# Patient Record
Sex: Female | Born: 1940
Health system: Southern US, Community
[De-identification: ages and names within clinical notes are randomized; demographics above are authoritative.]

## PROBLEM LIST (undated history)

## (undated) DIAGNOSIS — E079 Disorder of thyroid, unspecified: Secondary | ICD-10-CM

## (undated) DIAGNOSIS — M8580 Other specified disorders of bone density and structure, unspecified site: Secondary | ICD-10-CM

## (undated) DIAGNOSIS — F411 Generalized anxiety disorder: Secondary | ICD-10-CM

## (undated) HISTORY — DX: Other specified disorders of bone density and structure, unspecified site: M85.80

## (undated) HISTORY — DX: Generalized anxiety disorder: F41.1

---

## 2007-05-26 HISTORY — PX: CHOLECYSTECTOMY: SHX55

## 2007-05-26 HISTORY — PX: ABDOMINAL HYSTERECTOMY: SHX81

## 2015-12-31 ENCOUNTER — Encounter (HOSPITAL_COMMUNITY): Payer: Self-pay

## 2015-12-31 DIAGNOSIS — S0101XA Laceration without foreign body of scalp, initial encounter: Secondary | ICD-10-CM | POA: Diagnosis not present

## 2015-12-31 DIAGNOSIS — S51812A Laceration without foreign body of left forearm, initial encounter: Secondary | ICD-10-CM | POA: Insufficient documentation

## 2015-12-31 DIAGNOSIS — Y9289 Other specified places as the place of occurrence of the external cause: Secondary | ICD-10-CM | POA: Diagnosis not present

## 2015-12-31 DIAGNOSIS — W228XXA Striking against or struck by other objects, initial encounter: Secondary | ICD-10-CM | POA: Insufficient documentation

## 2015-12-31 DIAGNOSIS — Y9353 Activity, golf: Secondary | ICD-10-CM | POA: Diagnosis not present

## 2015-12-31 DIAGNOSIS — Y999 Unspecified external cause status: Secondary | ICD-10-CM | POA: Insufficient documentation

## 2015-12-31 DIAGNOSIS — S0990XA Unspecified injury of head, initial encounter: Secondary | ICD-10-CM | POA: Diagnosis present

## 2015-12-31 NOTE — ED Triage Notes (Signed)
Pt was holding onto stick leaning into creek retrieving golf balls, when she went to get up stick broke, pt fell into creek hitting left side of head, bleeding controlled and laceration to left posterior forearm.

## 2016-01-01 ENCOUNTER — Emergency Department (HOSPITAL_COMMUNITY): Payer: Medicare Other

## 2016-01-01 ENCOUNTER — Emergency Department (HOSPITAL_COMMUNITY)
Admission: EM | Admit: 2016-01-01 | Discharge: 2016-01-01 | Disposition: A | Discharge status: Home / Self Care | Payer: Medicare Other | Attending: Emergency Medicine | Admitting: Emergency Medicine

## 2016-01-01 DIAGNOSIS — S0101XA Laceration without foreign body of scalp, initial encounter: Secondary | ICD-10-CM

## 2016-01-01 HISTORY — DX: Disorder of thyroid, unspecified: E07.9

## 2016-01-01 MED ORDER — LIDOCAINE-EPINEPHRINE (PF) 2 %-1:200000 IJ SOLN
20.0000 mL | Freq: Once | INTRAMUSCULAR | Status: AC
  Administered 2016-01-01: 20 mL

## 2016-01-01 MED ORDER — ACETAMINOPHEN 500 MG PO TABS
1000.0000 mg | ORAL_TABLET | Freq: Once | ORAL | Status: AC
  Administered 2016-01-01: 1000 mg via ORAL

## 2016-01-01 MED ORDER — LIDOCAINE HCL (PF) 2 % IJ SOLN
INTRAMUSCULAR | Status: AC
  Filled 2016-01-01: qty 10

## 2016-01-01 MED ORDER — LIDOCAINE-EPINEPHRINE (PF) 2 %-1:200000 IJ SOLN
INTRAMUSCULAR | Status: AC
  Administered 2016-01-01: 20 mL
  Filled 2016-01-01: qty 20

## 2016-01-01 MED ORDER — ACETAMINOPHEN 500 MG PO TABS
ORAL_TABLET | ORAL | Status: AC
  Filled 2016-01-01: qty 2

## 2016-01-01 NOTE — ED Notes (Signed)
Pt back from XR 

## 2016-01-01 NOTE — ED Notes (Signed)
Patient transported to CT 

## 2016-01-01 NOTE — ED Notes (Signed)
Pt and family understood dc material. NAD noted 

## 2016-01-01 NOTE — ED Notes (Signed)
Pt is frustrated at wait times. Spoke with MD and she state she would be in there soon. Relayed message to pt and family member.

## 2016-01-01 NOTE — Discharge Instructions (Signed)
Return for worsening symptoms, including increased redness, swelling, pus drainage, fever, escalating pain or any other symptoms concerning to you.  Your scalp staple will need to be take out in 7 days. Your arm laceration sutures will need to be removed in 10 days.

## 2016-01-01 NOTE — ED Provider Notes (Signed)
[facility] DEPT Provider Note   CSN: [id number] Arrival date & time: 12/31/15  2054  First Provider Contact:   First MD Initiated Contact with Patient 01/01/16 0226     By signing my name below, I, Arianna Nassar and Dolores Hoose attest that this documentation has been prepared under the direction and in the presence of Forde Dandy, MD.  Electronically Signed: Julien Nordmann, ED Scribe. 01/01/16. 2:21 AM.   History   Chief Complaint Chief Complaint  Patient presents with  . Laceration  . Head Injury    The history is provided by the patient. No language interpreter was used.     HPI Comments: Ivana Yusupov is a 75 y.o. female with PMHx of thyroid disease who presents to the Emergency Department presenting after a head injury that occurred this afternoon. She [place] to retrieve golf balls when she fell onto some rocks. Pt hit the left side of her head and her left forearm causing a laceration to both. She did not lose consciousness. She was ambulatory after the fall with no difficulty. Bleeding is currently controlled on her head and left forearm lacerations. Pt states she is not on any blood thinners. She denies neck pain, back pain, nausea, vomiting, headache, vision or speech changes, confusion, trouble walking, stomach pains, leg pain and balance problems. Her last tetanus shot was ~ 3 months ago.    Past Medical History:  Diagnosis Date  . Thyroid disease     There are no active problems to display for this patient.   Past Surgical History:  Procedure Laterality Date  . ABDOMINAL HYSTERECTOMY    . CHOLECYSTECTOMY      OB History    No data available       Home Medications    Prior to Admission medications   Not on File    Family History History reviewed. No pertinent family history.  Social History Social History  Substance Use Topics  . Smoking status: Never Smoker  . Smokeless tobacco: Never Used    . Alcohol use No     Allergies   Review of patient's allergies indicates no known allergies.   Review of Systems Review of Systems   10/14 Systems reviewed and are negative for acute change except as noted in the HPI.   Physical Exam Updated Vital Signs BP 160/69   Pulse (!) 53   Temp 98.2 F (36.8 C) (Oral)   Resp 20   Ht 5\' 7"  (1.702 m)   Wt 146 lb (66.2 kg)   SpO2 99%   BMI 22.87 kg/m   Physical Exam Physical Exam  Nursing note and vitals reviewed. Constitutional: Well developed, well nourished, non-toxic, and in no acute distress Head: Normocephalic. 1 cm laceration to left temporal scalp.  Mouth/Throat: Oropharynx is clear and moist.  Neck: Normal range of motion. Neck supple.  Cardiovascular: Normal rate and regular rhythm.   Pulmonary/Chest: Effort normal and breath sounds normal.  Abdominal: Soft. There is no tenderness. There is no rebound and no guarding.  Musculoskeletal: No CTLS spine tenderness, Step offs or deformities. 3 cm irregular gaping laceration to proximal left forearm.  Neurological: Alert, no facial droop, fluent speech, moves all extremities symmetrically Skin: Skin is warm and dry.  Psychiatric: Cooperative   ED Treatments / Results  DIAGNOSTIC STUDIES: Oxygen Saturation is 95% on RA, adequate by my interpretation.  COORDINATION OF CARE: 2:26 AM Discussed treatment plan which includes CT head,  forearm X-ray, sutures to left forearm, stable to head and Tylenol with pt at bedside and pt agreed to plan.  Labs (all labs ordered are listed, but only abnormal results are displayed) Labs Reviewed - No data to display  EKG  EKG Interpretation None       Radiology Dg Forearm Left  Result Date: 01/01/2016 CLINICAL DATA:  Laceration after fall in creek. Rule out foreign body. EXAM: LEFT FOREARM - 2 VIEW COMPARISON:  None. FINDINGS: Laceration are noted about the dorsal soft tissues of the mid forearm. There is no overlying dressing in  place. No evidence for radiopaque foreign body. No acute fracture or dislocation. IMPRESSION: Soft tissue laceration. No foreign body or acute osseous abnormality. Electronically Signed   By: Jeb Levering M.D.   On: 01/01/2016 03:32   Ct Head Wo Contrast  Result Date: 01/01/2016 CLINICAL DATA:  Initial evaluation for acute trauma, fall. Scalp laceration. EXAM: CT HEAD WITHOUT CONTRAST CT CERVICAL SPINE WITHOUT CONTRAST TECHNIQUE: Multidetector CT imaging of the head and cervical spine was performed following the standard protocol without intravenous contrast. Multiplanar CT image reconstructions of the cervical spine were also generated. COMPARISON:  None. FINDINGS: CT HEAD FINDINGS Probable tiny scalp contusion noted at the left frontal scalp. Associated laceration present. Scalp soft tissues otherwise unremarkable. No acute abnormality about the globes and orbits. Paranasal sinuses are clear.  No mastoid effusion. Calvarium intact. Age-related cerebral atrophy present. Mild chronic small vessel ischemic disease. No acute intracranial hemorrhage or infarct identified. No mass lesion, midline shift, or mass effect. No hydrocephalus. No extra-axial fluid collection. CT CERVICAL SPINE FINDINGS The vertebral bodies are normally aligned with preservation of the normal cervical lordosis. Vertebral body heights are preserved. Normal C1-2 articulations are intact. No prevertebral soft tissue swelling. No acute fracture or listhesis. Moderate degenerative spondylolysis as evidenced by intervertebral disc space narrowing, endplate sclerosis, and osteophytosis present at C6-7. Visualized soft tissues of the neck are within normal limits. No apical pneumothorax. Irregular 7 mm nodular density partially visualize within the right lung. IMPRESSION: CT BRAIN: 1. No acute intracranial process. 2. Small left frontal scalp contusion/laceration. 3. Mild chronic small vessel ischemic disease. CT CERVICAL SPINE: 1. No acute  traumatic injury within the cervical spine. 2. Moderate degenerative spondylolysis at C6-7. 3. 7 mm irregular nodular density partially visualize within the right lung, indeterminate. This could be further assessed with dedicated cross-sectional imaging of the chest. Electronically Signed   By: Jeannine Boga M.D.   On: 01/01/2016 04:33   Ct Cervical Spine Wo Contrast  Result Date: 01/01/2016 CLINICAL DATA:  Initial evaluation for acute trauma, fall. Scalp laceration. EXAM: CT HEAD WITHOUT CONTRAST CT CERVICAL SPINE WITHOUT CONTRAST TECHNIQUE: Multidetector CT imaging of the head and cervical spine was performed following the standard protocol without intravenous contrast. Multiplanar CT image reconstructions of the cervical spine were also generated. COMPARISON:  None. FINDINGS: CT HEAD FINDINGS Probable tiny scalp contusion noted at the left frontal scalp. Associated laceration present. Scalp soft tissues otherwise unremarkable. No acute abnormality about the globes and orbits. Paranasal sinuses are clear.  No mastoid effusion. Calvarium intact. Age-related cerebral atrophy present. Mild chronic small vessel ischemic disease. No acute intracranial hemorrhage or infarct identified. No mass lesion, midline shift, or mass effect. No hydrocephalus. No extra-axial fluid collection. CT CERVICAL SPINE FINDINGS The vertebral bodies are normally aligned with preservation of the normal cervical lordosis. Vertebral body heights are preserved. Normal C1-2 articulations are intact. No prevertebral soft tissue swelling. No acute  fracture or listhesis. Moderate degenerative spondylolysis as evidenced by intervertebral disc space narrowing, endplate sclerosis, and osteophytosis present at C6-7. Visualized soft tissues of the neck are within normal limits. No apical pneumothorax. Irregular 7 mm nodular density partially visualize within the right lung. IMPRESSION: CT BRAIN: 1. No acute intracranial process. 2. Small left  frontal scalp contusion/laceration. 3. Mild chronic small vessel ischemic disease. CT CERVICAL SPINE: 1. No acute traumatic injury within the cervical spine. 2. Moderate degenerative spondylolysis at C6-7. 3. 7 mm irregular nodular density partially visualize within the right lung, indeterminate. This could be further assessed with dedicated cross-sectional imaging of the chest. Electronically Signed   By: Jeannine Boga M.D.   On: 01/01/2016 04:33    Procedures .Marland KitchenLaceration Repair Date/Time: 01/01/2016 6:55 AM Performed by: Brantley Stage DUO Authorized by: Brantley Stage DUO   Consent:    Consent obtained:  Verbal   Consent given by:  Patient   Risks discussed:  Infection, pain, need for additional repair, poor cosmetic result and poor wound healing Laceration details:    Location:  Shoulder/arm   Shoulder/arm location:  L lower arm   Length (cm):  3 Repair type:    Repair type:  Complex Pre-procedure details:    Preparation:  Patient was prepped and draped in usual sterile fashion Exploration:    Limited defect created (wound extended): no     Hemostasis achieved with:  Direct pressure   Wound exploration: wound explored through full range of motion     Contaminated: no   Treatment:    Area cleansed with:  Betadine   Amount of cleaning:  Standard   Irrigation solution:  Tap water   Irrigation volume:  500   Irrigation method:  Syringe   Debridement:  Moderate   Undermining:  Minimal   Scar revision: no   Skin repair:    Repair method:  Sutures   Suture size:  4-0   Suture technique:  Horizontal mattress   Number of sutures:  5 Approximation:    Approximation:  Close   Vermilion border: well-aligned   Post-procedure details:    Dressing:  Antibiotic ointment, sterile dressing and tube gauze   Patient tolerance of procedure:  Tolerated well, no immediate complications .Marland KitchenLaceration Repair Date/Time: 01/01/2016 6:57 AM Performed by: Brantley Stage DUO Authorized by: Brantley Stage DUO     Consent:    Consent obtained:  Verbal   Consent given by:  Patient   Risks discussed:  Infection, pain, poor cosmetic result and need for additional repair Laceration details:    Location:  Scalp   Scalp location:  L temporal   Length (cm):  1 Repair type:    Repair type:  Simple Exploration:    Hemostasis achieved with:  Direct pressure   Contaminated: no   Treatment:    Area cleansed with:  Betadine   Amount of cleaning:  Standard   Irrigation solution:  Tap water Skin repair:    Repair method:  Staples   Number of staples:  1 Approximation:    Approximation:  Close   Vermilion border: well-aligned   Post-procedure details:    Dressing:  Open (no dressing)   Patient tolerance of procedure:  Tolerated well, no immediate complications   (including critical care time)  Medications Ordered in ED Medications  acetaminophen (TYLENOL) tablet 1,000 mg (1,000 mg Oral Given 01/01/16 0303)  lidocaine-EPINEPHrine (XYLOCAINE W/EPI) 2 %-1:200000 (PF) injection 20 mL (20 mLs Infiltration Given 01/01/16 0557)  Initial Impression / Assessment and Plan / ED Course  I have reviewed the triage vital signs and the nursing notes.  Pertinent labs & imaging results that were available during my care of the patient were reviewed by me and considered in my medical decision making (see chart for details).  Clinical Course   75 year old female who presents after mechanical fall w/ scalp laceration and left arm laceration. No blood thinners. Neuro in tact.  CT head and cervical spine negative for acute traumatic injury. Lacerations repaired. No other injuries noted. Tetanus up to date. Wound care discussed. Strict return and follow-up instructions reviewed. She expressed understanding of all discharge instructions and felt comfortable with the plan of care.   Final Clinical Impressions(s) / ED Diagnoses   Final diagnoses:  Scalp laceration, initial encounter    New Prescriptions There  are no discharge medications for this patient.  I personally performed the services described in this documentation, which was scribed in my presence. The recorded information has been reviewed and is accurate.     Forde Dandy, MD 01/01/16 0700

## 2016-01-01 NOTE — ED Notes (Signed)
MD at bedside. 

## 2016-05-29 LAB — HM DEXA SCAN

## 2017-03-24 LAB — LIPID PANEL
CHOLESTEROL: 269 — AB (ref 0–200)
HDL: 81 — AB (ref 35–70)
LDL CALC: 167
TRIGLYCERIDES: 103 (ref 40–160)

## 2017-03-24 LAB — BASIC METABOLIC PANEL
BUN: 16 (ref 4–21)
CREATININE: 0.9 (ref 0.5–1.1)
GLUCOSE: 92
Potassium: 4.8 (ref 3.4–5.3)
SODIUM: 139 (ref 137–147)

## 2017-03-24 LAB — TSH: TSH: 1.15 (ref 0.41–5.90)

## 2018-06-02 ENCOUNTER — Ambulatory Visit (INDEPENDENT_AMBULATORY_CARE_PROVIDER_SITE_OTHER): Payer: Medicare Other | Admitting: Family Medicine

## 2018-06-02 ENCOUNTER — Encounter: Payer: Self-pay | Admitting: Family Medicine

## 2018-06-02 ENCOUNTER — Other Ambulatory Visit: Payer: Self-pay

## 2018-06-02 VITALS — BP 112/72 | HR 55 | Temp 97.8°F | Resp 14 | Ht 65.5 in | Wt 141.4 lb

## 2018-06-02 DIAGNOSIS — M8580 Other specified disorders of bone density and structure, unspecified site: Secondary | ICD-10-CM | POA: Insufficient documentation

## 2018-06-02 DIAGNOSIS — E039 Hypothyroidism, unspecified: Secondary | ICD-10-CM | POA: Diagnosis not present

## 2018-06-02 DIAGNOSIS — F411 Generalized anxiety disorder: Secondary | ICD-10-CM | POA: Diagnosis not present

## 2018-06-02 DIAGNOSIS — F410 Panic disorder [episodic paroxysmal anxiety] without agoraphobia: Secondary | ICD-10-CM

## 2018-06-02 DIAGNOSIS — Z1239 Encounter for other screening for malignant neoplasm of breast: Secondary | ICD-10-CM | POA: Diagnosis not present

## 2018-06-02 DIAGNOSIS — M810 Age-related osteoporosis without current pathological fracture: Secondary | ICD-10-CM | POA: Insufficient documentation

## 2018-06-02 DIAGNOSIS — Z79890 Hormone replacement therapy (postmenopausal): Secondary | ICD-10-CM | POA: Diagnosis not present

## 2018-06-02 HISTORY — DX: Other specified disorders of bone density and structure, unspecified site: M85.80

## 2018-06-02 HISTORY — DX: Generalized anxiety disorder: F41.1

## 2018-06-02 LAB — TSH: TSH: 1.55 u[IU]/mL (ref 0.35–4.50)

## 2018-06-02 MED ORDER — ESTRADIOL 1 MG PO TABS: 1.0000 mg | ORAL_TABLET | Freq: Every day | ORAL | 3 refills | Status: DC

## 2018-06-02 MED ORDER — SERTRALINE HCL 50 MG PO TABS: 50.0000 mg | ORAL_TABLET | Freq: Every day | ORAL | 3 refills | Status: DC

## 2018-06-02 MED ORDER — ZOSTER VAC RECOMB ADJUVANTED 50 MCG/0.5ML IM SUSR: 0.5000 mL | Freq: Once | INTRAMUSCULAR | 0 refills | Status: AC

## 2018-06-02 MED ORDER — LEVOTHYROXINE SODIUM 75 MCG PO TABS: 75.0000 ug | ORAL_TABLET | Freq: Every day | ORAL | 3 refills | Status: DC

## 2018-06-02 NOTE — Patient Instructions (Addendum)
Please return in 1-6 months for your annual complete physical; please come fasting.  Go get your Shingrix vaccination at [facility].   I will refill your thyroid medication tomorrow once your thyroid test is back.   I have decreased your sertraline dose to 50mg  nightly. Let me know if this causes any problems.   We will consider slowly weaning your hormones over time as well.  We will call you with information regarding your referral appointment. Mammogram.  If you do not hear from [place] within the next 2 weeks, please let me know. It can take 1-2 weeks to get appointments set up with the specialists.   If you have any questions or concerns, please don't hesitate to send me a message via MyChart or call the office at [phone]. Thank you for visiting with [place] today! It's our pleasure caring for you.

## 2018-06-02 NOTE — Addendum Note (Signed)
Addended by: Billey Chang on: 06/02/2018 04:35 PM   Modules accepted: Orders

## 2018-06-02 NOTE — Progress Notes (Signed)
Subjective  CC:  Chief Complaint  Patient presents with  . Establish Care    Last CPE was 1 year ago, needs refill on medications  . Anxiety  . Hypothyroidism    HPI: Patricia Patrick is a 78 y.o. female who presents to [place] at [facility] today to establish care with me as a new patient.   Very pleasant, overall healthy, 78 year old female who moved to the area several months back from [place].  She had lived in [place] 35 years prior to being in [place].  1 son currently lives in [place], 2 sisters live here in [place], and one son remains in [place].  She is married and lives with her husband.  She is active.  Lives a healthy lifestyle   Depression screen [facility] 2/9 06/02/2018  Decreased Interest 0  Down, Depressed, Hopeless 0  PHQ - 2 Score 0  Altered sleeping 0  Tired, decreased energy 0  Change in appetite 0  Feeling bad or failure about yourself  0  Trouble concentrating 0  Moving slowly or fidgety/restless 0  Suicidal thoughts 0  PHQ-9 Score 0  Difficult doing work/chores Not difficult at all   GAD 7 : Generalized Anxiety Score 06/02/2018  Nervous, Anxious, on Edge 0  Control/stop worrying 0  Worry too much - different things 0  Trouble relaxing 0  Restless 0  Easily annoyed or irritable 0  Afraid - awful might happen 0  Total GAD 7 Score 0  Anxiety Difficulty Not difficult at all    She has the following concerns or needs:  Hypothyroidism: On Synthroid for several years.  Has been well controlled.  She denies current symptoms of high or low thyroid.  Due for refills.  Last checked a year ago.  HRT, chronic: Started in her 1s.  Has no menopausal symptoms.  Cardiovascular risk status appears to be low.  General anxiety disorder with history of panic attacks on Zoloft 100 mg nightly.  Needs refills.  Does well.  Has had no problems for years.  Never has tried to come off medicines.  No history of depression.  No adverse  effects.  Health maintenance: Has not had a mammogram for several years.  Had recent bone density that was reportedly normal.  Immunizations are up-to-date except for Shingrix.  She is a candidate for this.  We will need to check to see if she is had a colonoscopy.  Assessment  1. Acquired hypothyroidism   2. Hormone replacement therapy (HRT)   3. GAD (generalized anxiety disorder)   4. Panic attack   5. Breast cancer screening      Plan   Hypothyroidism, recheck today.  Clinically euthyroid.  Refill medications  HRT chronically: Discussed options.  For now we will continue but will recommend weaning dose slowly over the next 6 to 12 months.  General anxiety disorder, well controlled.  Decrease Zoloft to 50 mg daily to see if she tolerates this.  Health maintenance: Order mammogram today.  To return for complete physical, colorectal cancer screening discussion at that time  Follow up:  Return in about 3 months (around 09/01/2018) for complete physical. Orders Placed This Encounter  Procedures  . HM DEXA SCAN  . MM DIGITAL SCREENING BILATERAL  . TSH   Meds ordered this encounter  Medications  . Zoster Vaccine Adjuvanted [facility]) injection    Sig: Inject 0.5 mLs into the muscle once for 1 dose. Please give 2nd dose 2-6 months  after first dose    Dispense:  2 each    Refill:  0  . sertraline (ZOLOFT) 50 MG tablet    Sig: Take 1 tablet (50 mg total) by mouth daily.    Dispense:  90 tablet    Refill:  3  . estradiol (ESTRACE) 1 MG tablet    Sig: Take 1 tablet (1 mg total) by mouth daily.    Dispense:  90 tablet    Refill:  3     Depression screen PHQ 2/9 06/02/2018  Decreased Interest 0  Down, Depressed, Hopeless 0  PHQ - 2 Score 0  Altered sleeping 0  Tired, decreased energy 0  Change in appetite 0  Feeling bad or failure about yourself  0  Trouble concentrating 0  Moving slowly or fidgety/restless 0  Suicidal thoughts 0  PHQ-9 Score 0  Difficult doing work/chores  Not difficult at all    We updated and reviewed the patient's past history in detail and it is documented below.  Patient Active Problem List   Diagnosis Date Noted  . Hormone replacement therapy (HRT) 06/02/2018    Started during perimenopause.  Never stopped.   . Acquired hypothyroidism 06/02/2018  . GAD (generalized anxiety disorder) 06/02/2018    Reports history of panic attacks and anxiety: Unclear reasons.  Started on Zoloft and is started well.  Started around 2012.  Has never tried coming off of the medication.  Currently well controlled.   . Panic attack 06/02/2018   Health Maintenance  Topic Date Due  . MAMMOGRAM  11/17/1958  . DEXA SCAN  03/04/2019  . TETANUS/TDAP  10/21/2025  . INFLUENZA VACCINE  Completed  . PNA vac Low Risk Adult  Completed   Immunization History  Administered Date(s) Administered  . Influenza, High Dose Seasonal PF 02/21/2015, 03/23/2016  . Influenza-Unspecified 02/21/2017  . Pneumococcal Conjugate-13 10/22/2015  . Pneumococcal Polysaccharide-23 03/24/2017  . Tdap 10/22/2015   Current Meds  Medication Sig  . estradiol (ESTRACE) 1 MG tablet Take 1 tablet (1 mg total) by mouth daily.  Marland Kitchen levothyroxine (SYNTHROID, LEVOTHROID) 75 MCG tablet Take 1 tablet by mouth daily.  . sertraline (ZOLOFT) 50 MG tablet Take 1 tablet (50 mg total) by mouth daily.  . [DISCONTINUED] estradiol (ESTRACE) 1 MG tablet Take 1 tablet by mouth daily.  . [DISCONTINUED] sertraline (ZOLOFT) 100 MG tablet Take 1 tablet by mouth daily.    Allergies: Patient has No Known Allergies. Past Medical History Patient  has a past medical history of GAD (generalized anxiety disorder) (06/02/2018) and Thyroid disease. Past Surgical History Patient  has a past surgical history that includes Cholecystectomy (2009) and Abdominal hysterectomy (2009). Family History: Patient family history includes Cancer in her sister; Healthy in her son and son; Heart attack in her father. Social  History:  Patient  reports that she has never smoked. She has never used smokeless tobacco. She reports that she does not drink alcohol or use drugs.  Review of Systems: Constitutional: negative for fever or malaise Ophthalmic: negative for photophobia, double vision or loss of vision Cardiovascular: negative for chest pain, dyspnea on exertion, or new LE swelling Respiratory: negative for SOB or persistent cough Gastrointestinal: negative for abdominal pain, change in bowel habits or melena Genitourinary: negative for dysuria or gross hematuria Musculoskeletal: negative for new gait disturbance or muscular weakness Integumentary: negative for new or persistent rashes Neurological: negative for TIA or stroke symptoms Psychiatric: negative for SI or delusions Allergic/Immunologic: negative for hives  Patient Care  Team    Relationship Specialty Notifications Start End  Leamon Arnt, MD PCP - General Family Medicine  06/02/18     Objective  Vitals: BP 112/72   Pulse (!) 55   Temp 97.8 F (36.6 C) (Oral)   Resp 14   Ht 5' 5.5" (1.664 m)   Wt 141 lb 6.4 oz (64.1 kg)   SpO2 97%   BMI 23.17 kg/m  General:  Well developed, well nourished, no acute distress  Psych:  Alert and oriented,normal mood and affect HEENT:  Normocephalic, atraumatic, non-icteric sclera, PERRL, oropharynx is without mass or exudate, supple neck without adenopathy, mass or thyromegaly Cardiovascular:  RRR without gallop, rub or murmur, nondisplaced PMI Respiratory:  Good breath sounds bilaterally, CTAB with normal respiratory effort Skin:  Warm, no rashes or suspicious lesions noted Neurologic:    Mental status is normal. Gross motor and sensory exams are normal. Normal gait   Commons side effects, risks, benefits, and alternatives for medications and treatment plan prescribed today were discussed, and the patient expressed understanding of the given instructions. Patient is instructed to call or message via  MyChart if he/she has any questions or concerns regarding our treatment plan. No barriers to understanding were identified. We discussed Red Flag symptoms and signs in detail. Patient expressed understanding regarding what to do in case of urgent or emergency type symptoms.   Medication list was reconciled, printed and provided to the patient in AVS. Patient instructions and summary information was reviewed with the patient as documented in the AVS. This note was prepared with assistance of Dragon voice recognition software. Occasional wrong-word or sound-a-like substitutions may have occurred due to the inherent limitations of voice recognition software

## 2018-06-16 ENCOUNTER — Encounter: Payer: Self-pay | Admitting: *Deleted

## 2018-08-02 ENCOUNTER — Encounter: Payer: Self-pay | Admitting: Physician Assistant

## 2018-08-02 ENCOUNTER — Other Ambulatory Visit: Payer: Self-pay

## 2018-08-02 ENCOUNTER — Ambulatory Visit (INDEPENDENT_AMBULATORY_CARE_PROVIDER_SITE_OTHER): Payer: Medicare Other | Admitting: Physician Assistant

## 2018-08-02 VITALS — BP 110/78 | HR 76 | Temp 97.9°F | Resp 16 | Ht 66.0 in | Wt 141.2 lb

## 2018-08-02 DIAGNOSIS — H6123 Impacted cerumen, bilateral: Secondary | ICD-10-CM | POA: Diagnosis not present

## 2018-08-02 NOTE — Progress Notes (Signed)
Patient presents to clinic today c/o concerns of ear wax impaction. Notes ear fullness bilaterally with some decreased hearing. Denies tinnitus, dizziness, ear pain or URI symptoms. Notes significant history of ear wax impaction. Is heading to the beach tomorrow and wanted ears cleaned out if possible.   Past Medical History:  Diagnosis Date  . GAD (generalized anxiety disorder) 06/02/2018  . Osteopenia 06/02/2018   DEXA 05/2016 lowest T = -1.8 10 FRAC score 1.8%, major frac score 9.9%; rec recheck 2 years and calcium and vit D.  . Thyroid disease     Current Outpatient Medications on File Prior to Visit  Medication Sig Dispense Refill  . estradiol (ESTRACE) 1 MG tablet Take 1 tablet (1 mg total) by mouth daily. 90 tablet 3  . levothyroxine (SYNTHROID, LEVOTHROID) 75 MCG tablet Take 1 tablet (75 mcg total) by mouth daily. 90 tablet 3  . sertraline (ZOLOFT) 50 MG tablet Take 1 tablet (50 mg total) by mouth daily. 90 tablet 3   No current facility-administered medications on file prior to visit.     No Known Allergies  Family History  Problem Relation Age of Onset  . Heart attack Father   . Cancer Sister   . Healthy Son   . Healthy Son     Social History   Socioeconomic History  . Marital status: Married    Spouse name: Not on file  . Number of children: Not on file  . Years of education: Not on file  . Highest education level: Not on file  Occupational History  . Not on file  Social Needs  . Financial resource strain: Not on file  . Food insecurity:    Worry: Not on file    Inability: Not on file  . Transportation needs:    Medical: Not on file    Non-medical: Not on file  Tobacco Use  . Smoking status: Never Smoker  . Smokeless tobacco: Never Used  Substance and Sexual Activity  . Alcohol use: No  . Drug use: No  . Sexual activity: Not on file  Lifestyle  . Physical activity:    Days per week: Not on file    Minutes per session: Not on file  . Stress: Not on  file  Relationships  . Social connections:    Talks on phone: Not on file    Gets together: Not on file    Attends religious service: Not on file    Active member of club or organization: Not on file    Attends meetings of clubs or organizations: Not on file    Relationship status: Not on file  Other Topics Concern  . Not on file  Social History Narrative  . Not on file    Review of Systems - See HPI.  All other ROS are negative.  BP 110/78   Pulse 76   Temp 97.9 F (36.6 C) (Oral)   Resp 16   Ht 5\' 6"  (1.676 m)   Wt 141 lb 4 oz (64.1 kg)   SpO2 98%   BMI 22.80 kg/m   Physical Exam Vitals signs reviewed.  Constitutional:      Appearance: Normal appearance.  HENT:     Head: Normocephalic and atraumatic.     Right Ear: There is impacted cerumen.     Left Ear: There is impacted cerumen.     Nose: Nose normal.     Mouth/Throat:     Mouth: Mucous membranes are moist.  Neck:  Musculoskeletal: Neck supple.  Cardiovascular:     Rate and Rhythm: Normal rate and regular rhythm.     Heart sounds: Normal heart sounds.  Pulmonary:     Effort: Pulmonary effort is normal.  Neurological:     Mental Status: She is alert.    Recent Results (from the past 2160 hour(s))  TSH     Status: None   Collection Time: 06/02/18 11:39 AM  Result Value Ref Range   TSH 1.55 0.35 - 4.50 uIU/mL    Assessment/Plan: 1. Bilateral impacted cerumen Unable to remove with curette. Removed via Lavage x 2. Repeat examination completely within normal limits. Home measures discussed. Follow-up PRN and as scheduled with PCP.   Leeanne Rio, PA-C

## 2018-08-02 NOTE — Patient Instructions (Signed)
Earwax Buildup, Adult  The ears produce a substance called earwax that helps keep bacteria out of the ear and protects the skin in the ear canal. Occasionally, earwax can build up in the ear and cause discomfort or hearing loss.  What increases the risk?  This condition is more likely to develop in people who:  · Are female.  · Are elderly.  · Naturally produce more earwax.  · Clean their ears often with cotton swabs.  · Use earplugs often.  · Use in-ear headphones often.  · Wear hearing aids.  · Have narrow ear canals.  · Have earwax that is overly thick or sticky.  · Have eczema.  · Are dehydrated.  · Have excess hair in the ear canal.  What are the signs or symptoms?  Symptoms of this condition include:  · Reduced or muffled hearing.  · A feeling of fullness in the ear or feeling that the ear is plugged.  · Fluid coming from the ear.  · Ear pain.  · Ear itch.  · Ringing in the ear.  · Coughing.  · An obvious piece of earwax that can be seen inside the ear canal.  How is this diagnosed?  This condition may be diagnosed based on:  · Your symptoms.  · Your medical history.  · An ear exam. During the exam, your health care provider will look into your ear with an instrument called an otoscope.  You may have tests, including a hearing test.  How is this treated?  This condition may be treated by:  · Using ear drops to soften the earwax.  · Having the earwax removed by a health care provider. The health care provider may:  ? Flush the ear with water.  ? Use an instrument that has a loop on the end (curette).  ? Use a suction device.  · Surgery to remove the wax buildup. This may be done in severe cases.  Follow these instructions at home:    · Take over-the-counter and prescription medicines only as told by your health care provider.  · Do not put any objects, including cotton swabs, into your ear. You can clean the opening of your ear canal with a washcloth or facial tissue.  · Follow instructions from your health care  provider about cleaning your ears. Do not over-clean your ears.  · Drink enough fluid to keep your urine clear or pale yellow. This will help to thin the earwax.  · Keep all follow-up visits as told by your health care provider. If earwax builds up in your ears often or if you use hearing aids, consider seeing your health care provider for routine, preventive ear cleanings. Ask your health care provider how often you should schedule your cleanings.  · If you have hearing aids, clean them according to instructions from the manufacturer and your health care provider.  Contact a health care provider if:  · You have ear pain.  · You develop a fever.  · You have blood, pus, or other fluid coming from your ear.  · You have hearing loss.  · You have ringing in your ears that does not go away.  · Your symptoms do not improve with treatment.  · You feel like the room is spinning (vertigo).  Summary  · Earwax can build up in the ear and cause discomfort or hearing loss.  · The most common symptoms of this condition include reduced or muffled hearing and a feeling of   fullness in the ear or feeling that the ear is plugged.  · This condition may be diagnosed based on your symptoms, your medical history, and an ear exam.  · This condition may be treated by using ear drops to soften the earwax or by having the earwax removed by a health care provider.  · Do not put any objects, including cotton swabs, into your ear. You can clean the opening of your ear canal with a washcloth or facial tissue.  This information is not intended to replace advice given to you by your health care provider. Make sure you discuss any questions you have with your health care provider.  Document Released: 06/18/2004 Document Revised: 04/22/2017 Document Reviewed: 07/22/2016  Elsevier Interactive Patient Education © 2019 [employer] Inc[employer]

## 2019-01-24 DIAGNOSIS — L821 Other seborrheic keratosis: Secondary | ICD-10-CM | POA: Diagnosis not present

## 2019-01-24 DIAGNOSIS — D225 Melanocytic nevi of trunk: Secondary | ICD-10-CM | POA: Diagnosis not present

## 2019-01-24 DIAGNOSIS — L814 Other melanin hyperpigmentation: Secondary | ICD-10-CM | POA: Diagnosis not present

## 2019-02-14 ENCOUNTER — Ambulatory Visit (INDEPENDENT_AMBULATORY_CARE_PROVIDER_SITE_OTHER): Payer: Medicare Other

## 2019-02-14 ENCOUNTER — Other Ambulatory Visit: Payer: Self-pay

## 2019-02-14 ENCOUNTER — Encounter: Payer: Self-pay | Admitting: Family Medicine

## 2019-02-14 VITALS — BP 116/70 | Temp 97.7°F | Ht 66.0 in | Wt 140.0 lb

## 2019-02-14 DIAGNOSIS — Z0000 Encounter for general adult medical examination without abnormal findings: Secondary | ICD-10-CM

## 2019-02-14 DIAGNOSIS — Z23 Encounter for immunization: Secondary | ICD-10-CM

## 2019-02-14 NOTE — Progress Notes (Signed)
Subjective:   Patricia Patrick is a 78 y.o. female who presents for Medicare Annual (Subsequent) preventive examination.  Review of Systems:   Cardiac Risk Factors include: advanced age (>61men, >18 women)     Objective:     Vitals: BP 116/70   Temp 97.7 F (36.5 C) (Temporal)   Ht 5\' 6"  (1.676 m)   Wt 140 lb (63.5 kg)   BMI 22.60 kg/m   Body mass index is 22.6 kg/m.  Advanced Directives 02/14/2019 12/31/2015  Does Patient Have a Medical Advance Directive? Yes No  Type of Advance Directive Living will -  Does patient want to make changes to medical advance directive? No - Patient declined -    Tobacco Social History   Tobacco Use  Smoking Status Never Smoker  Smokeless Tobacco Never Used     Counseling given: Not Answered   Clinical Intake:  Pre-visit preparation completed: Yes  Pain : No/denies pain     Diabetes: No  How often do you need to have someone help you when you read instructions, pamphlets, or other written materials from your doctor or pharmacy?: 1 - Never  Interpreter Needed?: No  Information entered by :: Denman George LPN  Past Medical History:  Diagnosis Date  . GAD (generalized anxiety disorder) 06/02/2018  . Osteopenia 06/02/2018   DEXA 05/2016 lowest T = -1.8 10 FRAC score 1.8%, major frac score 9.9%; rec recheck 2 years and calcium and vit D.  . Thyroid disease    Past Surgical History:  Procedure Laterality Date  . ABDOMINAL HYSTERECTOMY  2009  . CHOLECYSTECTOMY  2009   Family History  Problem Relation Age of Onset  . Heart attack Father   . Cancer Sister   . Healthy Son   . Healthy Son    Social History   Socioeconomic History  . Marital status: Married    Spouse name: Not on file  . Number of children: Not on file  . Years of education: Not on file  . Highest education level: Not on file  Occupational History  . Not on file  Social Needs  . Financial resource strain: Not on file  . Food insecurity    Worry: Not  on file    Inability: Not on file  . Transportation needs    Medical: Not on file    Non-medical: Not on file  Tobacco Use  . Smoking status: Never Smoker  . Smokeless tobacco: Never Used  Substance and Sexual Activity  . Alcohol use: No  . Drug use: No  . Sexual activity: Not on file  Lifestyle  . Physical activity    Days per week: Not on file    Minutes per session: Not on file  . Stress: Not on file  Relationships  . Social Herbalist on phone: Not on file    Gets together: Not on file    Attends religious service: Not on file    Active member of club or organization: Not on file    Attends meetings of clubs or organizations: Not on file    Relationship status: Not on file  Other Topics Concern  . Not on file  Social History Narrative   Previously lived in [place]    Has twin sisters who live in the area     Outpatient Encounter Medications as of 02/14/2019  Medication Sig  . estradiol (ESTRACE) 1 MG tablet Take 1 tablet (1 mg total) by mouth daily.  Marland Kitchen  levothyroxine (SYNTHROID, LEVOTHROID) 75 MCG tablet Take 1 tablet (75 mcg total) by mouth daily.  . sertraline (ZOLOFT) 50 MG tablet Take 1 tablet (50 mg total) by mouth daily.   No facility-administered encounter medications on file as of 02/14/2019.     Activities of Daily Living In your present state of health, do you have any difficulty performing the following activities: 02/14/2019  Hearing? N  Vision? N  Difficulty concentrating or making decisions? N  Walking or climbing stairs? N  Dressing or bathing? N  Doing errands, shopping? N  Preparing Food and eating ? N  Using the Toilet? N  In the past six months, have you accidently leaked urine? N  Do you have problems with loss of bowel control? N  Managing your Medications? N  Managing your Finances? N  Housekeeping or managing your Housekeeping? N  Some recent data might be hidden    Patient Care Team: Leamon Arnt, MD as PCP - General  (Family Medicine)    Assessment:   This is a routine wellness examination for [place].  Exercise Activities and Dietary recommendations Current Exercise Habits: The patient does not participate in regular exercise at present  Goals    . Patient Stated     Maintain health and enjoy time with family        Fall Risk Fall Risk  02/14/2019 08/02/2018 06/02/2018  Falls in the past year? 0 0 0  Number falls in past yr: 0 0 0  Injury with Fall? 0 0 0  Follow up Falls evaluation completed;Education provided;Falls prevention discussed - Falls evaluation completed   Is the patient's home free of loose throw rugs in walkways, pet beds, electrical cords, etc?   yes      Grab bars in the bathroom? yes      Handrails on the stairs?   yes      Adequate lighting?   yes  Timed Get Up and Go performed: completed and within normal timeframe; no gait abnormalities noted    Depression Screen PHQ 2/9 Scores 02/14/2019 08/02/2018 06/02/2018  PHQ - 2 Score 0 0 0  PHQ- 9 Score - - 0     Cognitive Function-no cognitive concerns at this time  MMSE - Mini Mental State Exam 02/14/2019  Orientation to time 5  Orientation to Place 5  Registration 3  Attention/ Calculation 5  Recall 3  Language- name 2 objects 2  Language- repeat 1  Language- follow 3 step command 3  Language- read & follow direction 1  Write a sentence 1  Copy design 1  Total score 30      Immunization History  Administered Date(s) Administered  . Fluad Quad(high Dose 65+) 02/14/2019  . Influenza, High Dose Seasonal PF 02/21/2015, 03/23/2016  . Influenza-Unspecified 02/21/2017  . Pneumococcal Conjugate-13 10/22/2015  . Pneumococcal Polysaccharide-23 03/24/2017  . Tdap 10/22/2015  . Zoster Recombinat (Shingrix) 06/02/2018, 08/01/2018    Qualifies for Shingles Vaccine? Shingrix series completed   Screening Tests Health Maintenance  Topic Date Due  . MAMMOGRAM  11/17/1958  . DEXA SCAN  03/04/2019  . TETANUS/TDAP  10/21/2025   . INFLUENZA VACCINE  Completed  . PNA vac Low Risk Adult  Completed    Cancer Screenings: Lung: Low Dose CT Chest recommended if Age 64-80 years, 30 pack-year currently smoking OR have quit w/in 15years. Patient does not qualify. Breast:  Up to date on Mammogram? Recommended; patient will call back when she is ready to scheduled  Up  to date of Bone Density/Dexa? Recommended; patient will call back when she is ready to schedule  Colorectal: not indicated      Plan:    I have personally reviewed and addressed the Medicare Annual Wellness questionnaire and have noted the following in the patient's chart:  A. Medical and social history B. Use of alcohol, tobacco or illicit drugs  C. Current medications and supplements D. Functional ability and status E.  Nutritional status F.  Physical activity G. Advance directives H. List of other physicians I.  Hospitalizations, surgeries, and ER visits in previous 12 months J.  [place] such as hearing and vision if needed, cognitive and depression L. Referrals, records requested, and appointments- none   In addition, I have reviewed and discussed with patient certain preventive protocols, quality metrics, and best practice recommendations. A written personalized care plan for preventive services as well as general preventive health recommendations were provided to patient.   Signed,  Denman George, LPN  Nurse Health Advisor   Nurse Notes: no additional

## 2019-02-14 NOTE — Patient Instructions (Signed)
[name] , Thank you for taking time to come for your Medicare Wellness Visit. I appreciate your ongoing commitment to your health goals. Please review the following plan we discussed and let me know if I can assist you in the future.   Screening recommendations/referrals: Colorectal Screening: not indicated Mammogram: not indicated but still covered  Bone Density: recommended   Vision and Dental Exams: Recommended annual ophthalmology exams for early detection of glaucoma and other disorders of the eye Recommended annual dental exams for proper oral hygiene  Vaccinations: Influenza vaccine: today Pneumococcal vaccine: up to date; last 03/24/17 Tdap vaccine: up to date; last 10/22/15   Shingles vaccine: Please call your insurance company to determine your out of pocket expense for the Shingrix vaccine. You may receive this vaccine at your local pharmacy.  Advanced directives: Please bring a copy of your POA (Power of Attorney) and/or Living Will to your next appointment.  Goals: Recommend to drink at least 6-8 8oz glasses of water per day.  Next appointment: Please schedule your Annual Wellness Visit with your Nurse Health Advisor in one year.  Preventive Care 70 Years and Older, Female Preventive care refers to lifestyle choices and visits with your health care provider that can promote health and wellness. What does preventive care include?  A yearly physical exam. This is also called an annual well check.  Dental exams once or twice a year.  Routine eye exams. Ask your health care provider how often you should have your eyes checked.  Personal lifestyle choices, including:  Daily care of your teeth and gums.  Regular physical activity.  Eating a healthy diet.  Avoiding tobacco and drug use.  Limiting alcohol use.  Practicing safe sex.  Taking low-dose aspirin every day if recommended by your health care provider.  Taking vitamin and mineral supplements as  recommended by your health care provider. What happens during an annual well check? The services and screenings done by your health care provider during your annual well check will depend on your age, overall health, lifestyle risk factors, and family history of disease. Counseling  Your health care provider may ask you questions about your:  Alcohol use.  Tobacco use.  Drug use.  Emotional well-being.  Home and relationship well-being.  Sexual activity.  Eating habits.  History of falls.  Memory and ability to understand (cognition).  Work and work Statistician.  Reproductive health. Screening  You may have the following tests or measurements:  Height, weight, and BMI.  Blood pressure.  Lipid and cholesterol levels. These may be checked every 5 years, or more frequently if you are over 69 years old.  Skin check.  Lung cancer screening. You may have this screening every year starting at age 58 if you have a 30-pack-year history of smoking and currently smoke or have quit within the past 15 years.  Fecal occult blood test (FOBT) of the stool. You may have this test every year starting at age 84.  Flexible sigmoidoscopy or colonoscopy. You may have a sigmoidoscopy every 5 years or a colonoscopy every 10 years starting at age 27.  Hepatitis C blood test.  Hepatitis B blood test.  Sexually transmitted disease (STD) testing.  Diabetes screening. This is done by checking your blood sugar (glucose) after you have not eaten for a while (fasting). You may have this done every 1-3 years.  Bone density scan. This is done to screen for osteoporosis. You may have this done starting at age 84.  Mammogram.  This may be done every 1-2 years. Talk to your health care provider about how often you should have regular mammograms. Talk with your health care provider about your test results, treatment options, and if necessary, the need for more tests. Vaccines  Your health care  provider may recommend certain vaccines, such as:  Influenza vaccine. This is recommended every year.  Tetanus, diphtheria, and acellular pertussis (Tdap, Td) vaccine. You may need a Td booster every 10 years.  Zoster vaccine. You may need this after age 77.  Pneumococcal 13-valent conjugate (PCV13) vaccine. One dose is recommended after age 52.  Pneumococcal polysaccharide (PPSV23) vaccine. One dose is recommended after age 17. Talk to your health care provider about which screenings and vaccines you need and how often you need them. This information is not intended to replace advice given to you by your health care provider. Make sure you discuss any questions you have with your health care provider. Document Released: 06/07/2015 Document Revised: 01/29/2016 Document Reviewed: 03/12/2015 Elsevier Interactive Patient Education  2017 [place] Prevention in the Home Falls can cause injuries. They can happen to people of all ages. There are many things you can do to make your home safe and to help prevent falls. What can I do on the outside of my home?  Regularly fix the edges of walkways and driveways and fix any cracks.  Remove anything that might make you trip as you walk through a door, such as a raised step or threshold.  Trim any bushes or trees on the path to your home.  Use bright outdoor lighting.  Clear any walking paths of anything that might make someone trip, such as rocks or tools.  Regularly check to see if handrails are loose or broken. Make sure that both sides of any steps have handrails.  Any raised decks and porches should have guardrails on the edges.  Have any leaves, snow, or ice cleared regularly.  Use sand or salt on walking paths during winter.  Clean up any spills in your garage right away. This includes oil or grease spills. What can I do in the bathroom?  Use night lights.  Install grab bars by the toilet and in the tub and shower. Do  not use towel bars as grab bars.  Use non-skid mats or decals in the tub or shower.  If you need to sit down in the shower, use a plastic, non-slip stool.  Keep the floor dry. Clean up any water that spills on the floor as soon as it happens.  Remove soap buildup in the tub or shower regularly.  Attach bath mats securely with double-sided non-slip rug tape.  Do not have throw rugs and other things on the floor that can make you trip. What can I do in the bedroom?  Use night lights.  Make sure that you have a light by your bed that is easy to reach.  Do not use any sheets or blankets that are too big for your bed. They should not hang down onto the floor.  Have a firm chair that has side arms. You can use this for support while you get dressed.  Do not have throw rugs and other things on the floor that can make you trip. What can I do in the kitchen?  Clean up any spills right away.  Avoid walking on wet floors.  Keep items that you use a lot in easy-to-reach places.  If you need to reach something  above you, use a strong step stool that has a grab bar.  Keep electrical cords out of the way.  Do not use floor polish or wax that makes floors slippery. If you must use wax, use non-skid floor wax.  Do not have throw rugs and other things on the floor that can make you trip. What can I do with my stairs?  Do not leave any items on the stairs.  Make sure that there are handrails on both sides of the stairs and use them. Fix handrails that are broken or loose. Make sure that handrails are as long as the stairways.  Check any carpeting to make sure that it is firmly attached to the stairs. Fix any carpet that is loose or worn.  Avoid having throw rugs at the top or bottom of the stairs. If you do have throw rugs, attach them to the floor with carpet tape.  Make sure that you have a light switch at the top of the stairs and the bottom of the stairs. If you do not have them,  ask someone to add them for you. What else can I do to help prevent falls?  Wear shoes that:  Do not have high heels.  Have rubber bottoms.  Are comfortable and fit you well.  Are closed at the toe. Do not wear sandals.  If you use a stepladder:  Make sure that it is fully opened. Do not climb a closed stepladder.  Make sure that both sides of the stepladder are locked into place.  Ask someone to hold it for you, if possible.  Clearly mark and make sure that you can see:  Any grab bars or handrails.  First and last steps.  Where the edge of each step is.  Use tools that help you move around (mobility aids) if they are needed. These include:  Canes.  Walkers.  Scooters.  Crutches.  Turn on the lights when you go into a dark area. Replace any light bulbs as soon as they burn out.  Set up your furniture so you have a clear path. Avoid moving your furniture around.  If any of your floors are uneven, fix them.  If there are any pets around you, be aware of where they are.  Review your medicines with your doctor. Some medicines can make you feel dizzy. This can increase your chance of falling. Ask your doctor what other things that you can do to help prevent falls. This information is not intended to replace advice given to you by your health care provider. Make sure you discuss any questions you have with your health care provider. Document Released: 03/07/2009 Document Revised: 10/17/2015 Document Reviewed: 06/15/2014 Elsevier Interactive Patient Education  2017 [employer].

## 2019-02-14 NOTE — Progress Notes (Signed)
I have reviewed the documentation from the recent AWV done by Courtney Slade, RN; I agree with the documentation and will follow up on any recommendations or abnormal findings as suggested.  

## 2019-02-20 DIAGNOSIS — H6120 Impacted cerumen, unspecified ear: Secondary | ICD-10-CM | POA: Diagnosis not present

## 2019-05-17 ENCOUNTER — Other Ambulatory Visit: Payer: Self-pay | Admitting: Family Medicine

## 2019-05-17 MED ORDER — LEVOTHYROXINE SODIUM 75 MCG PO TABS: 75.0000 ug | ORAL_TABLET | Freq: Every day | ORAL | 0 refills | Status: DC

## 2019-05-17 MED ORDER — SERTRALINE HCL 50 MG PO TABS: 50.0000 mg | ORAL_TABLET | Freq: Every day | ORAL | 0 refills | Status: DC

## 2019-05-17 NOTE — Telephone Encounter (Signed)
Medication Refill - Medication: sertraline (ZOLOFT) 50 MG tablet, estradiol (ESTRACE) 1 MG tablet, levothyroxine (SYNTHROID, LEVOTHROID) 75 MCG tablet  Has the patient contacted their pharmacy? Yes.   (Agent: If no, request that the patient contact the pharmacy for the refill.) (Agent: If yes, when and what did the pharmacy advise?)  Preferred Pharmacy (with phone number or street name): [facility], [place] - [address] [place] [address] AT [address] OF [place] [address]  Agent: Please be advised that RX refills may take up to 3 business days. We ask that you follow-up with your pharmacy.

## 2019-05-17 NOTE — Telephone Encounter (Signed)
Requested medication (s) are due for refill today: yes  Requested medication (s) are on the active medication list: yes  Last refill:  06/02/2018  Future visit scheduled: yes  Notes to clinic:  mammogram is not up to date Review for refill   Requested Prescriptions  Pending Prescriptions Disp Refills   estradiol (ESTRACE) 1 MG tablet 90 tablet 3    Sig: Take 1 tablet (1 mg total) by mouth daily.      OB/GYN:  Estrogens Failed - 05/17/2019 11:42 AM      Failed - Mammogram is up-to-date per Health Maintenance      Passed - Last BP in normal range    BP Readings from Last 1 Encounters:  02/14/19 116/70          Passed - Valid encounter within last 12 months    Recent Outpatient Visits           9 months ago Bilateral impacted cerumen   [facility] [place] Brunetta Jeans, [place]   11 months ago Acquired hypothyroidism   [facility] [place], [place]       Future Appointments             In 3 weeks Leamon Arnt, MD [place], [place]             Signed Prescriptions Disp Refills   sertraline (ZOLOFT) 50 MG tablet 90 tablet 0    Sig: Take 1 tablet (50 mg total) by mouth daily.      Psychiatry:  Antidepressants - SSRI Passed - 05/17/2019 11:42 AM      Passed - Valid encounter within last 6 months    Recent Outpatient Visits           9 months ago Bilateral impacted cerumen   [facility] [place] Brunetta Jeans, [place]   11 months ago Acquired hypothyroidism   [facility] [place], [place]       Future Appointments             In 3 weeks Leamon Arnt, MD [place], [facility]              levothyroxine (SYNTHROID) 75 MCG tablet 90 tablet 0    Sig: Take 1 tablet (75 mcg total) by mouth daily.      Endocrinology:  Hypothyroid Agents Failed - 05/17/2019 11:42  AM      Failed - TSH needs to be rechecked within 3 months after an abnormal result. Refill until TSH is due.      Passed - TSH in normal range and within 360 days    TSH  Date Value Ref Range Status  06/02/2018 1.55 0.35 - 4.50 uIU/mL Final          Passed - Valid encounter within last 12 months    Recent Outpatient Visits           9 months ago Bilateral impacted cerumen   [facility] [place], [place], [place]   11 months ago Acquired hypothyroidism   [facility] [place], [place]       Future Appointments             In 3 weeks Leamon Arnt, MD [place], [place]

## 2019-05-18 MED ORDER — ESTRADIOL 1 MG PO TABS: 1.0000 mg | ORAL_TABLET | Freq: Every day | ORAL | 3 refills | Status: DC

## 2019-06-12 ENCOUNTER — Other Ambulatory Visit: Payer: Self-pay

## 2019-06-13 ENCOUNTER — Ambulatory Visit (INDEPENDENT_AMBULATORY_CARE_PROVIDER_SITE_OTHER): Payer: Medicare Other | Admitting: Family Medicine

## 2019-06-13 ENCOUNTER — Encounter: Payer: Self-pay | Admitting: Family Medicine

## 2019-06-13 VITALS — BP 140/66 | HR 55 | Temp 97.9°F | Resp 98 | Ht 66.0 in | Wt 135.0 lb

## 2019-06-13 DIAGNOSIS — M8580 Other specified disorders of bone density and structure, unspecified site: Secondary | ICD-10-CM | POA: Diagnosis not present

## 2019-06-13 DIAGNOSIS — F411 Generalized anxiety disorder: Secondary | ICD-10-CM

## 2019-06-13 DIAGNOSIS — Z1231 Encounter for screening mammogram for malignant neoplasm of breast: Secondary | ICD-10-CM | POA: Diagnosis not present

## 2019-06-13 DIAGNOSIS — Z79890 Hormone replacement therapy (postmenopausal): Secondary | ICD-10-CM

## 2019-06-13 DIAGNOSIS — Z0000 Encounter for general adult medical examination without abnormal findings: Secondary | ICD-10-CM | POA: Diagnosis not present

## 2019-06-13 DIAGNOSIS — F410 Panic disorder [episodic paroxysmal anxiety] without agoraphobia: Secondary | ICD-10-CM

## 2019-06-13 DIAGNOSIS — E039 Hypothyroidism, unspecified: Secondary | ICD-10-CM

## 2019-06-13 DIAGNOSIS — E2839 Other primary ovarian failure: Secondary | ICD-10-CM

## 2019-06-13 LAB — CBC WITH DIFFERENTIAL/PLATELET
Basophils Absolute: 0 10*3/uL (ref 0.0–0.1)
Basophils Relative: 0.6 % (ref 0.0–3.0)
Eosinophils Absolute: 0.1 10*3/uL (ref 0.0–0.7)
Eosinophils Relative: 1 % (ref 0.0–5.0)
HCT: 42.1 % (ref 36.0–46.0)
Hemoglobin: 14.1 g/dL (ref 12.0–15.0)
Lymphocytes Relative: 32.8 % (ref 12.0–46.0)
Lymphs Abs: 2.2 10*3/uL (ref 0.7–4.0)
MCHC: 33.5 g/dL (ref 30.0–36.0)
MCV: 89.6 fl (ref 78.0–100.0)
Monocytes Absolute: 0.2 10*3/uL (ref 0.1–1.0)
Monocytes Relative: 3.5 % (ref 3.0–12.0)
Neutro Abs: 4.2 10*3/uL (ref 1.4–7.7)
Neutrophils Relative %: 62.1 % (ref 43.0–77.0)
Platelets: 219 10*3/uL (ref 150.0–400.0)
RBC: 4.7 Mil/uL (ref 3.87–5.11)
RDW: 13 % (ref 11.5–15.5)
WBC: 6.7 10*3/uL (ref 4.0–10.5)

## 2019-06-13 LAB — COMPREHENSIVE METABOLIC PANEL
ALT: 7 U/L (ref 0–35)
AST: 14 U/L (ref 0–37)
Albumin: 4 g/dL (ref 3.5–5.2)
Alkaline Phosphatase: 86 U/L (ref 39–117)
BUN: 20 mg/dL (ref 6–23)
CO2: 29 mEq/L (ref 19–32)
Calcium: 9.2 mg/dL (ref 8.4–10.5)
Chloride: 104 mEq/L (ref 96–112)
Creatinine, Ser: 0.77 mg/dL (ref 0.40–1.20)
GFR: 72.58 mL/min (ref >60.00)
Glucose, Bld: 84 mg/dL (ref 70–99)
Potassium: 4 mEq/L (ref 3.5–5.1)
Sodium: 140 mEq/L (ref 135–145)
Total Bilirubin: 0.4 mg/dL (ref 0.2–1.2)
Total Protein: 6.4 g/dL (ref 6.0–8.3)

## 2019-06-13 LAB — LIPID PANEL
Cholesterol: 233 mg/dL — ABNORMAL HIGH (ref 0–200)
HDL: 79.1 mg/dL (ref >39.00)
LDL Cholesterol: 135 mg/dL — ABNORMAL HIGH (ref 0–99)
NonHDL: 153.83
Total CHOL/HDL Ratio: 3
Triglycerides: 95 mg/dL (ref 0.0–149.0)
VLDL: 19 mg/dL (ref 0.0–40.0)

## 2019-06-13 LAB — TSH: TSH: 0.95 u[IU]/mL (ref 0.35–4.50)

## 2019-06-13 MED ORDER — SERTRALINE HCL 50 MG PO TABS: 50.0000 mg | ORAL_TABLET | Freq: Every day | ORAL | 3 refills | Status: DC

## 2019-06-13 NOTE — Progress Notes (Signed)
Subjective  Chief Complaint  Patient presents with  . Annual Exam  . Hypothyroidism  . Anxiety    HPI: Patricia Patrick is a 79 y.o. female who presents to [facility] today for a Female Wellness Visit. She also has the concerns and/or needs as listed above in the chief complaint. These will be addressed in addition to the Health Maintenance Visit.   Wellness Visit: annual visit with health maintenance review and exam without Pap   79 year old female for health maintenance and physical: Continues to thrive.  Feels well overall.  She does take estradiol 1 mg daily and has been on this for over 2 decades.  She would consider slow weaning given risk versus benefit ratio.  Continues exercise.  Lives a healthy lifestyle.  Has questions about the Covid vaccination.  Due for mammogram Chronic disease f/u and/or acute problem visit: (deemed necessary to be done in addition to the wellness visit):  Hypothyroidism: Energy levels are good.  No signs or symptoms of low or high thyroid.  Compliant with medications.  Due for recheck.  Anxiety disorder stable on current Zoloft.  No concerns at this time.  Managing pandemic stressors well.  HRT: Noted above  Osteopenia: Due for repeat DEXA.  Has been on long-term estrogen replacement therapy.  Assessment  1. Annual physical exam   2. Acquired hypothyroidism   3. GAD (generalized anxiety disorder)   4. Panic attack   5. Osteopenia, unspecified location   6. Hormone replacement therapy (HRT)   7. Encounter for screening mammogram for malignant neoplasm of breast   8. Hypoestrogenism      Plan  Female Wellness Visit:  Age appropriate Health Maintenance and Prevention measures were discussed with patient. Included topics are cancer screening recommendations, ways to keep healthy (see AVS) including dietary and exercise recommendations, regular eye and dental care, use of seat belts, and avoidance of moderate alcohol  use and tobacco use.  Mammo and DEXA ordered  BMI: discussed patient's BMI and encouraged positive lifestyle modifications to help get to or maintain a target BMI.  HM needs and immunizations were addressed and ordered. See below for orders. See HM and immunization section for updates.  Up-to-date, recommend Covid vaccination  Routine labs and screening tests ordered including cmp, cbc and lipids where appropriate.  Discussed recommendations regarding Vit D and calcium supplementation (see AVS)  Chronic disease management visit and/or acute problem visit:  Hypothyroidism for recheck today  HRT: Counseling done.  Consider slow wean.  If symptoms return could consider long-term continuation for quality of life however she may no longer need it.  Anxiety disorder is well controlled on Zoloft. Follow up: Return in about 1 year (around 06/12/2020) for complete physical, AWV at patient's convenience.  Orders Placed This Encounter  Procedures  . MM DIGITAL SCREENING BILATERAL  . DG Bone Density  . CBC with Differential/Platelet  . Comprehensive metabolic panel  . Lipid panel  . TSH   Meds ordered this encounter  Medications  . sertraline (ZOLOFT) 50 MG tablet    Sig: Take 1 tablet (50 mg total) by mouth daily.    Dispense:  90 tablet    Refill:  3    Please hold for next due refill. thanks      Lifestyle: Body mass index is 21.79 kg/m. Wt Readings from Last 3 Encounters:  06/13/19 135 lb (61.2 kg)  02/14/19 140 lb (63.5 kg)  08/02/18 141 lb 4 oz (64.1 kg)  Patient Active Problem List   Diagnosis Date Noted  . Hormone replacement therapy (HRT) 06/02/2018    Started during perimenopause.  Never stopped.   . Acquired hypothyroidism 06/02/2018  . GAD (generalized anxiety disorder) 06/02/2018    Reports history of panic attacks and anxiety: Unclear reasons.  Started on Zoloft and is started well.  Started around 2012.  Has never tried coming off of the medication.   Currently well controlled.   . Panic attack 06/02/2018  . Osteopenia 06/02/2018    DEXA 05/2016 lowest T = -1.8 10 FRAC score 1.8%, major frac score 9.9%; rec recheck 2 years and calcium and vit D.    Health Maintenance  Topic Date Due  . MAMMOGRAM  11/17/1958  . DEXA SCAN  03/03/2020  . TETANUS/TDAP  10/21/2025  . INFLUENZA VACCINE  Completed  . PNA vac Low Risk Adult  Completed   Immunization History  Administered Date(s) Administered  . Fluad Quad(high Dose 65+) 02/14/2019  . Influenza, High Dose Seasonal PF 02/21/2015, 03/23/2016  . Influenza-Unspecified 02/21/2017  . Pneumococcal Conjugate-13 10/22/2015  . Pneumococcal Polysaccharide-23 03/24/2017  . Tdap 10/22/2015  . Zoster Recombinat (Shingrix) 06/02/2018, 08/01/2018   We updated and reviewed the patient's past history in detail and it is documented below. Allergies: Patient is allergic to lobster  [shellfish allergy]. Past Medical History Patient  has a past medical history of GAD (generalized anxiety disorder) (06/02/2018), Osteopenia (06/02/2018), and Thyroid disease. Past Surgical History Patient  has a past surgical history that includes Cholecystectomy (2009) and Abdominal hysterectomy (2009). Family History: Patient family history includes Cancer in her sister; Healthy in her son and son; Heart attack in her father. Social History:  Patient  reports that she has never smoked. She has never used smokeless tobacco. She reports that she does not drink alcohol or use drugs.  Review of Systems: Constitutional: negative for fever or malaise Ophthalmic: negative for photophobia, double vision or loss of vision Cardiovascular: negative for chest pain, dyspnea on exertion, or new LE swelling Respiratory: negative for SOB or persistent cough Gastrointestinal: negative for abdominal pain, change in bowel habits or melena Genitourinary: negative for dysuria or gross hematuria, no abnormal uterine bleeding or  disharge Musculoskeletal: negative for new gait disturbance or muscular weakness Integumentary: negative for new or persistent rashes, no breast lumps Neurological: negative for TIA or stroke symptoms Psychiatric: negative for SI or delusions Allergic/Immunologic: negative for hives  Patient Care Team    Relationship Specialty Notifications Start End  Leamon Arnt, MD PCP - General Family Medicine  06/02/18     Objective  Vitals: BP 140/66 (BP Location: Left Arm, Patient Position: Sitting, Cuff Size: Normal)   Pulse (!) 55   Temp 97.9 F (36.6 C) (Temporal)   Resp (!) 98   Ht 5\' 6"  (1.676 m)   Wt 135 lb (61.2 kg)   BMI 21.79 kg/m  General:  Well developed, well nourished, no acute distress  Psych:  Alert and orientedx3,normal mood and affect HEENT:  Normocephalic, atraumatic, non-icteric sclera, PERRL, oropharynx is clear without mass or exudate, supple neck without adenopathy, mass or thyromegaly Cardiovascular:  Normal S1, S2, RRR without gallop, rub or murmur, nondisplaced PMI Respiratory:  Good breath sounds bilaterally, CTAB with normal respiratory effort Gastrointestinal: normal bowel sounds, soft, non-tender, no noted masses. No HSM MSK: no deformities, contusions. Joints are without erythema or swelling. Spine and CVA region are nontender Skin:  Warm, no rashes or suspicious lesions noted Neurologic:    Mental  status is normal. CN 2-11 are normal. Gross motor and sensory exams are normal. Normal gait. No tremor Breast Exam: No mass, skin retraction or nipple discharge is appreciated in either breast. No axillary adenopathy. Fibrocystic changes are not noted    Commons side effects, risks, benefits, and alternatives for medications and treatment plan prescribed today were discussed, and the patient expressed understanding of the given instructions. Patient is instructed to call or message via MyChart if he/she has any questions or concerns regarding our treatment plan. No  barriers to understanding were identified. We discussed Red Flag symptoms and signs in detail. Patient expressed understanding regarding what to do in case of urgent or emergency type symptoms.   Medication list was reconciled, printed and provided to the patient in AVS. Patient instructions and summary information was reviewed with the patient as documented in the AVS. This note was prepared with assistance of Dragon voice recognition software. Occasional wrong-word or sound-a-like substitutions may have occurred due to the inherent limitations of voice recognition software  This visit occurred during the SARS-CoV-2 public health emergency.  Safety protocols were in place, including screening questions prior to the visit, additional usage of staff PPE, and extensive cleaning of exam room while observing appropriate contact time as indicated for disinfecting solutions.

## 2019-06-13 NOTE — Patient Instructions (Signed)
Please return in 12 months for your annual complete physical; please come fasting. Medicare recommends an Annual Wellness Visit for all patients. Please schedule this to be done with our Nurse Educator, Loma Sousa. This is an informative "talk" visit; it's goals are to ensure that your health care needs are being met and to give you education regarding avoiding falls, ensuring you are not suffering from depression or problems with memory or thinking, and to educate you on Advance Care Planning. It helps me take good care of you!   I will release your lab results to you on your MyChart account with further instructions. Please reply with any questions.   I have ordered a mammogram and bone density for you - we will call you to get you scheduled.   If you have any questions or concerns, please don't hesitate to send me a message via MyChart or call the office at [phone]. Thank you for visiting with [place] today! It's our pleasure caring for you.  Please do these things to maintain good health!   Exercise at least 30-45 minutes a day,  4-5 days a week.   Eat a low-fat diet with lots of fruits and vegetables, up to 7-9 servings per day.  Drink plenty of water daily. Try to drink 8 8oz glasses per day.  Seatbelts can save your life. Always wear your seatbelt.  Place Smoke Detectors on every level of your home and check batteries every year.  Schedule an appointment with an eye doctor for an eye exam every 1-2 years  Safe sex - use condoms to protect yourself from STDs if you could be exposed to these types of infections. Use birth control if you do not want to become pregnant and are sexually active.  Avoid heavy alcohol use. If you drink, keep it to less than 2 drinks/day and not every day.  [place].  Choose someone you trust that could speak for you if you became unable to speak for yourself.  Depression is common in our stressful world.If you're feeling down or  losing interest in things you normally enjoy, please come in for a visit.  If anyone is threatening or hurting you, please get help. Physical or Emotional Violence is never OK.

## 2019-06-26 ENCOUNTER — Other Ambulatory Visit: Payer: Self-pay | Admitting: Family Medicine

## 2019-06-26 DIAGNOSIS — M8580 Other specified disorders of bone density and structure, unspecified site: Secondary | ICD-10-CM

## 2019-06-26 DIAGNOSIS — E2839 Other primary ovarian failure: Secondary | ICD-10-CM

## 2019-07-04 ENCOUNTER — Ambulatory Visit (INDEPENDENT_AMBULATORY_CARE_PROVIDER_SITE_OTHER): Payer: Medicare Other | Admitting: Family Medicine

## 2019-07-04 ENCOUNTER — Encounter: Payer: Self-pay | Admitting: Family Medicine

## 2019-07-04 VITALS — Temp 97.2°F | Ht 66.0 in | Wt 135.0 lb

## 2019-07-04 DIAGNOSIS — J301 Allergic rhinitis due to pollen: Secondary | ICD-10-CM | POA: Diagnosis not present

## 2019-07-04 DIAGNOSIS — R490 Dysphonia: Secondary | ICD-10-CM | POA: Diagnosis not present

## 2019-07-04 NOTE — Progress Notes (Signed)
Virtual Visit via Video Note  Subjective  CC:  Chief Complaint  Patient presents with  . Hoarse    throat feels irritated, hard to swallow a week after 1st covid vaccine on 06/16/2019. Due for next on 07/07/2019     I connected with [name] on 07/04/19 at  3:20 PM EST by a video enabled telemedicine application and verified that I am speaking with the correct person using two identifiers. Location patient: Home Location provider: [facility] [place], Office Persons participating in the virtual visit: Desirae Reichmann, Leamon Arnt, MD Serita Sheller, [place] discussed the limitations of evaluation and management by telemedicine and the availability of in person appointments. The patient expressed understanding and agreed to proceed. HPI: Patricia Patrick is a 79 y.o. female who was contacted today to address the problems listed above in the chief complaint. . 79 yo mostly healthy female with 2-3 day h/o hoarseness with PND. No sneezing, fever, malaise, ST, ear pain, sinus pain, but admits to rare cough to "try to cough up drainage". No sob. No exposure to sick contacts. No myalgias. Had 1st covid vaccine 2 weeks ago and wants to be sure she is ok to take the 2nd on the 12th. Reports h/o intermittent allergies in past. Has zyrtec at home but not using it.  Assessment  1. Seasonal allergic rhinitis due to pollen   2. Hoarse voice quality      Plan   SAR and secondary hoarseness:  Suspect hoarse due to PND. Could be mild viral URI as well. rec zyrtec and time. Reassured. Ok to get vaccine.  I discussed the assessment and treatment plan with the patient. The patient was provided an opportunity to ask questions and all were answered. The patient agreed with the plan and demonstrated an understanding of the instructions.   The patient was advised to call back or seek an in-person evaluation if the symptoms worsen or if the condition fails to improve as  anticipated. Follow up: prn  Visit date not found  No orders of the defined types were placed in this encounter.     I reviewed the patients updated PMH, FH, and SocHx.    Patient Active Problem List   Diagnosis Date Noted  . Hormone replacement therapy (HRT) 06/02/2018  . Acquired hypothyroidism 06/02/2018  . GAD (generalized anxiety disorder) 06/02/2018  . Panic attack 06/02/2018  . Osteopenia 06/02/2018   Current Meds  Medication Sig  . estradiol (ESTRACE) 1 MG tablet Take 1 tablet (1 mg total) by mouth daily.  [place] Kitchen levothyroxine (SYNTHROID) 75 MCG tablet Take 1 tablet (75 mcg total) by mouth daily.  . sertraline (ZOLOFT) 50 MG tablet Take 1 tablet (50 mg total) by mouth daily.    Allergies: Patient is allergic to lobster  [shellfish allergy]. Family History: Patient family history includes Cancer in her sister; Healthy in her son and son; Heart attack in her father. Social History:  Patient  reports that she has never smoked. She has never used smokeless tobacco. She reports that she does not drink alcohol or use drugs.  Review of Systems: Constitutional: Negative for fever malaise or anorexia Cardiovascular: negative for chest pain Respiratory: negative for SOB or persistent cough Gastrointestinal: negative for abdominal pain  OBJECTIVE Vitals: Temp (!) 97.2 F (36.2 C) (Temporal)   Ht 5\' 6"  (1.676 m)   Wt 135 lb (61.2 kg)   BMI 21.79 kg/m  General: no acute distress ,  A&Ox3 Appears well Hoarse voice  Leamon Arnt, MD

## 2019-08-10 ENCOUNTER — Other Ambulatory Visit: Payer: Self-pay | Admitting: Family Medicine

## 2019-09-12 DIAGNOSIS — L298 Other pruritus: Secondary | ICD-10-CM | POA: Diagnosis not present

## 2019-09-12 DIAGNOSIS — L820 Inflamed seborrheic keratosis: Secondary | ICD-10-CM | POA: Diagnosis not present

## 2019-09-13 ENCOUNTER — Ambulatory Visit
Admission: RE | Admit: 2019-09-13 | Discharge: 2019-09-13 | Disposition: A | Discharge status: Home / Self Care | Payer: Medicare Other | Source: Ambulatory Visit | Attending: Family Medicine | Admitting: Family Medicine

## 2019-09-13 ENCOUNTER — Other Ambulatory Visit: Payer: Self-pay

## 2019-09-13 DIAGNOSIS — Z1231 Encounter for screening mammogram for malignant neoplasm of breast: Secondary | ICD-10-CM | POA: Diagnosis not present

## 2019-09-13 DIAGNOSIS — Z780 Asymptomatic menopausal state: Secondary | ICD-10-CM | POA: Diagnosis not present

## 2019-09-13 DIAGNOSIS — E2839 Other primary ovarian failure: Secondary | ICD-10-CM

## 2019-09-13 DIAGNOSIS — M8580 Other specified disorders of bone density and structure, unspecified site: Secondary | ICD-10-CM

## 2019-09-13 DIAGNOSIS — M8589 Other specified disorders of bone density and structure, multiple sites: Secondary | ICD-10-CM | POA: Diagnosis not present

## 2019-09-15 ENCOUNTER — Other Ambulatory Visit: Payer: Self-pay

## 2019-09-18 ENCOUNTER — Encounter: Payer: Self-pay | Admitting: Family Medicine

## 2019-09-18 ENCOUNTER — Other Ambulatory Visit: Payer: Self-pay

## 2019-09-18 ENCOUNTER — Ambulatory Visit (INDEPENDENT_AMBULATORY_CARE_PROVIDER_SITE_OTHER): Payer: Medicare Other | Admitting: Family Medicine

## 2019-09-18 VITALS — BP 130/80 | HR 62 | Temp 98.4°F | Ht 66.0 in | Wt 139.6 lb

## 2019-09-18 DIAGNOSIS — H6123 Impacted cerumen, bilateral: Secondary | ICD-10-CM | POA: Diagnosis not present

## 2019-09-18 DIAGNOSIS — H6983 Other specified disorders of Eustachian tube, bilateral: Secondary | ICD-10-CM | POA: Diagnosis not present

## 2019-09-18 NOTE — Progress Notes (Signed)
   Patricia Patrick is a 79 y.o. female who presents today for an office visit.  Assessment/Plan:  Impacted cerumen Successfully irrigated by CMA today.  Patient tolerated well.  Recommended use of over-the-counter Debrox drops or hydrogen peroxide as needed to soften earwax prevent recurrent impaction.  Eustachian tube dysfunction Likely secondary to allergic rhinitis.  Recommended restart her allergy medications in addition to starting intranasal steroid such as Flonase.    Subjective:  HPI:  Patient was here concerned for cerumen impaction.  Said similar episodes in the past.  Over the last 2 weeks has had intermittent issues of feeling like her ears are getting stopped up.  Will predominate in the left ear.  Symptoms come and go.       Objective:  Physical Exam: Temp 98.4 F (36.9 C) (Temporal)   Ht 5\' 6"  (1.676 m)   Wt 139 lb 9.6 oz (63.3 kg)   BMI 22.53 kg/m   Gen: No acute distress, resting comfortably HEENT: EAC with impacted cerumen bilaterally.  After successful irrigation bilateral TMs with clear effusion. CV: Regular rate and rhythm with no murmurs appreciated Pulm: Normal work of breathing, clear to auscultation bilaterally with no crackles, wheezes, or rhonchi Neuro: Grossly normal, moves all extremities Psych: Normal affect and thought content      Ashling Roane M. Jerline Pain, MD 09/18/2019 2:46 PM

## 2019-09-18 NOTE — Patient Instructions (Addendum)
It was very nice to see you today!  We cleaned out your ears today.  Please use hydrogen peroxide or Debrox as needed to prevent buildup.  Please restart your allergy medications.  This will help with fluid buildup behind the eardrums.  Please let me know or let Dr. Jonni Sanger know if your symptoms are improving in next few weeks Her AVS is, now   Take care, Dr Jerline Pain  Please try these tips to maintain a healthy lifestyle:   Eat at least 3 REAL meals and 1-2 snacks per day.  Aim for no more than 5 hours between eating.  If you eat breakfast, please do so within one hour of getting up.    Each meal should contain half fruits/vegetables, one quarter protein, and one quarter carbs (no bigger than a computer mouse)   Cut down on sweet beverages. This includes juice, soda, and sweet tea.     Drink at least 1 glass of water with each meal and aim for at least 8 glasses per day   Exercise at least 150 minutes every week.

## 2019-09-28 DIAGNOSIS — H2513 Age-related nuclear cataract, bilateral: Secondary | ICD-10-CM | POA: Diagnosis not present

## 2019-11-09 ENCOUNTER — Other Ambulatory Visit: Payer: Self-pay

## 2019-11-09 MED ORDER — LEVOTHYROXINE SODIUM 75 MCG PO TABS: ORAL_TABLET | ORAL | 1 refills | Status: DC

## 2020-01-25 DIAGNOSIS — L821 Other seborrheic keratosis: Secondary | ICD-10-CM | POA: Diagnosis not present

## 2020-01-25 DIAGNOSIS — D485 Neoplasm of uncertain behavior of skin: Secondary | ICD-10-CM | POA: Diagnosis not present

## 2020-01-25 DIAGNOSIS — D225 Melanocytic nevi of trunk: Secondary | ICD-10-CM | POA: Diagnosis not present

## 2020-01-25 DIAGNOSIS — L814 Other melanin hyperpigmentation: Secondary | ICD-10-CM | POA: Diagnosis not present

## 2020-01-25 DIAGNOSIS — L570 Actinic keratosis: Secondary | ICD-10-CM | POA: Diagnosis not present

## 2020-01-25 DIAGNOSIS — L820 Inflamed seborrheic keratosis: Secondary | ICD-10-CM | POA: Diagnosis not present

## 2020-02-14 ENCOUNTER — Encounter: Payer: Self-pay | Admitting: Family Medicine

## 2020-02-20 ENCOUNTER — Ambulatory Visit: Payer: Medicare Other

## 2020-02-23 ENCOUNTER — Ambulatory Visit: Payer: Medicare Other

## 2020-05-06 ENCOUNTER — Other Ambulatory Visit: Payer: Self-pay | Admitting: Family Medicine

## 2020-06-07 ENCOUNTER — Other Ambulatory Visit: Payer: Self-pay

## 2020-06-07 ENCOUNTER — Ambulatory Visit (INDEPENDENT_AMBULATORY_CARE_PROVIDER_SITE_OTHER): Payer: Medicare Other

## 2020-06-07 DIAGNOSIS — Z0000 Encounter for general adult medical examination without abnormal findings: Secondary | ICD-10-CM | POA: Diagnosis not present

## 2020-06-07 NOTE — Patient Instructions (Addendum)
Ms. Woodford , Thank you for taking time to come for your Medicare Wellness Visit. I appreciate your ongoing commitment to your health goals. Please review the following plan we discussed and let me know if I can assist you in the future.   Screening recommendations/referrals: Colonoscopy: No longer required Mammogram: Done 09/13/19 Bone Density: Done 09/13/19 Recommended yearly ophthalmology/optometry visit for glaucoma screening and checkup Recommended yearly dental visit for hygiene and checkup  Vaccinations: Influenza vaccine: Done 02/11/20 Up to date Pneumococcal vaccine: Up to date Tdap vaccine: Up to date Shingles vaccine: Completed 1/9 & 08/01/18   Covid-19:Completed 06/16/19, 07/07/19, & 02/11/20  Advanced directives: Please bring a copy of your health care power of attorney and living will to the office at your convenience.  Conditions/risks identified: Lose weight  Next appointment: Follow up in one year for your annual wellness visit    Preventive Care 65 Years and Older, Female Preventive care refers to lifestyle choices and visits with your health care provider that can promote health and wellness. What does preventive care include?  A yearly physical exam. This is also called an annual well check.  Dental exams once or twice a year.  Routine eye exams. Ask your health care provider how often you should have your eyes checked.  Personal lifestyle choices, including:  Daily care of your teeth and gums.  Regular physical activity.  Eating a healthy diet.  Avoiding tobacco and drug use.  Limiting alcohol use.  Practicing safe sex.  Taking low-dose aspirin every day.  Taking vitamin and mineral supplements as recommended by your health care provider. What happens during an annual well check? The services and screenings done by your health care provider during your annual well check will depend on your age, overall health, lifestyle risk factors, and family  history of disease. Counseling  Your health care provider may ask you questions about your:  Alcohol use.  Tobacco use.  Drug use.  Emotional well-being.  Home and relationship well-being.  Sexual activity.  Eating habits.  History of falls.  Memory and ability to understand (cognition).  Work and work Statistician.  Reproductive health. Screening  You may have the following tests or measurements:  Height, weight, and BMI.  Blood pressure.  Lipid and cholesterol levels. These may be checked every 5 years, or more frequently if you are over 18 years old.  Skin check.  Lung cancer screening. You may have this screening every year starting at age 84 if you have a 30-pack-year history of smoking and currently smoke or have quit within the past 15 years.  Fecal occult blood test (FOBT) of the stool. You may have this test every year starting at age 55.  Flexible sigmoidoscopy or colonoscopy. You may have a sigmoidoscopy every 5 years or a colonoscopy every 10 years starting at age 26.  Hepatitis C blood test.  Hepatitis B blood test.  Sexually transmitted disease (STD) testing.  Diabetes screening. This is done by checking your blood sugar (glucose) after you have not eaten for a while (fasting). You may have this done every 1-3 years.  Bone density scan. This is done to screen for osteoporosis. You may have this done starting at age 67.  Mammogram. This may be done every 1-2 years. Talk to your health care provider about how often you should have regular mammograms. Talk with your health care provider about your test results, treatment options, and if necessary, the need for more tests. Vaccines  Your health care  provider may recommend certain vaccines, such as:  Influenza vaccine. This is recommended every year.  Tetanus, diphtheria, and acellular pertussis (Tdap, Td) vaccine. You may need a Td booster every 10 years.  Zoster vaccine. You may need this after  age 26.  Pneumococcal 13-valent conjugate (PCV13) vaccine. One dose is recommended after age 54.  Pneumococcal polysaccharide (PPSV23) vaccine. One dose is recommended after age 72. Talk to your health care provider about which screenings and vaccines you need and how often you need them. This information is not intended to replace advice given to you by your health care provider. Make sure you discuss any questions you have with your health care provider. Document Released: 06/07/2015 Document Revised: 01/29/2016 Document Reviewed: 03/12/2015 Elsevier Interactive Patient Education  2017 [employer] Prevention in the Home Falls can cause injuries. They can happen to people of all ages. There are many things you can do to make your home safe and to help prevent falls. What can I do on the outside of my home?  Regularly fix the edges of walkways and driveways and fix any cracks.  Remove anything that might make you trip as you walk through a door, such as a raised step or threshold.  Trim any bushes or trees on the path to your home.  Use bright outdoor lighting.  Clear any walking paths of anything that might make someone trip, such as rocks or tools.  Regularly check to see if handrails are loose or broken. Make sure that both sides of any steps have handrails.  Any raised decks and porches should have guardrails on the edges.  Have any leaves, snow, or ice cleared regularly.  Use sand or salt on walking paths during winter.  Clean up any spills in your garage right away. This includes oil or grease spills. What can I do in the bathroom?  Use night lights.  Install grab bars by the toilet and in the tub and shower. Do not use towel bars as grab bars.  Use non-skid mats or decals in the tub or shower.  If you need to sit down in the shower, use a plastic, non-slip stool.  Keep the floor dry. Clean up any water that spills on the floor as soon as it  happens.  Remove soap buildup in the tub or shower regularly.  Attach bath mats securely with double-sided non-slip rug tape.  Do not have throw rugs and other things on the floor that can make you trip. What can I do in the bedroom?  Use night lights.  Make sure that you have a light by your bed that is easy to reach.  Do not use any sheets or blankets that are too big for your bed. They should not hang down onto the floor.  Have a firm chair that has side arms. You can use this for support while you get dressed.  Do not have throw rugs and other things on the floor that can make you trip. What can I do in the kitchen?  Clean up any spills right away.  Avoid walking on wet floors.  Keep items that you use a lot in easy-to-reach places.  If you need to reach something above you, use a strong step stool that has a grab bar.  Keep electrical cords out of the way.  Do not use floor polish or wax that makes floors slippery. If you must use wax, use non-skid floor wax.  Do not have throw  rugs and other things on the floor that can make you trip. What can I do with my stairs?  Do not leave any items on the stairs.  Make sure that there are handrails on both sides of the stairs and use them. Fix handrails that are broken or loose. Make sure that handrails are as long as the stairways.  Check any carpeting to make sure that it is firmly attached to the stairs. Fix any carpet that is loose or worn.  Avoid having throw rugs at the top or bottom of the stairs. If you do have throw rugs, attach them to the floor with carpet tape.  Make sure that you have a light switch at the top of the stairs and the bottom of the stairs. If you do not have them, ask someone to add them for you. What else can I do to help prevent falls?  Wear shoes that:  Do not have high heels.  Have rubber bottoms.  Are comfortable and fit you well.  Are closed at the toe. Do not wear sandals.  If you  use a stepladder:  Make sure that it is fully opened. Do not climb a closed stepladder.  Make sure that both sides of the stepladder are locked into place.  Ask someone to hold it for you, if possible.  Clearly mark and make sure that you can see:  Any grab bars or handrails.  First and last steps.  Where the edge of each step is.  Use tools that help you move around (mobility aids) if they are needed. These include:  Canes.  Walkers.  Scooters.  Crutches.  Turn on the lights when you go into a dark area. Replace any light bulbs as soon as they burn out.  Set up your furniture so you have a clear path. Avoid moving your furniture around.  If any of your floors are uneven, fix them.  If there are any pets around you, be aware of where they are.  Review your medicines with your doctor. Some medicines can make you feel dizzy. This can increase your chance of falling. Ask your doctor what other things that you can do to help prevent falls. This information is not intended to replace advice given to you by your health care provider. Make sure you discuss any questions you have with your health care provider. Document Released: 03/07/2009 Document Revised: 10/17/2015 Document Reviewed: 06/15/2014 Elsevier Interactive Patient Education  2017 [employer].

## 2020-06-07 NOTE — Progress Notes (Addendum)
Virtual Visit via Telephone Note  I connected with  Nicholaus Bloom on 06/07/20 at 11:00 AM EST by telephone and verified that I am speaking with the correct person using two identifiers.  Medicare Annual Wellness visit completed telephonically due to Covid-19 pandemic.   Persons participating in this call: This Health Coach and this patient and Husband Araceli Bouche    Location: Patient: Home Provider: Office   I discussed the limitations, risks, security and privacy concerns of performing an evaluation and management service by telephone and the availability of in person appointments. The patient expressed understanding and agreed to proceed.  Unable to perform video visit due to video visit attempted and failed and/or patient does not have video capability.   Some vital signs may be absent or patient reported.   Willette Brace, LPN    Subjective:   MIKAYLA CHIUSANO is a 80 y.o. female who presents for Medicare Annual (Subsequent) preventive examination.  Review of Systems     Cardiac Risk Factors include: advanced age (>23men, >49 women)     Objective:    There were no vitals filed for this visit. There is no height or weight on file to calculate BMI.  Advanced Directives 06/07/2020 02/14/2019 12/31/2015  Does Patient Have a Medical Advance Directive? Yes Yes No  Type of Paramedic of [place];Living will Living will -  Does patient want to make changes to medical advance directive? - No - Patient declined -  Copy of [place] in Chart? No - copy requested - -    Current Medications (verified) Outpatient Encounter Medications as of 06/07/2020  Medication Sig  . estradiol (ESTRACE) 1 MG tablet TAKE 1 TABLET(1 MG) BY MOUTH DAILY  . levothyroxine (SYNTHROID) 75 MCG tablet TAKE 1 TABLET(75 MCG) BY MOUTH DAILY  . sertraline (ZOLOFT) 50 MG tablet Take 1 tablet (50 mg total) by mouth daily.  Marland Kitchen FLUAD QUADRIVALENT 0.5 ML injection     No facility-administered encounter medications on file as of 06/07/2020.    Allergies (verified) Lobster  [shellfish allergy]   History: Past Medical History:  Diagnosis Date  . GAD (generalized anxiety disorder) 06/02/2018  . Osteopenia 06/02/2018   DEXA 05/2016 lowest T = -1.8 10 FRAC score 1.8%, major frac score 9.9%; rec recheck 2 years and calcium and vit D.  . Thyroid disease    Past Surgical History:  Procedure Laterality Date  . ABDOMINAL HYSTERECTOMY  2009  . CHOLECYSTECTOMY  2009   Family History  Problem Relation Age of Onset  . Obesity Mother   . Heart attack Father   . Cancer Sister   . Healthy Son   . Healthy Son    Social History   Socioeconomic History  . Marital status: Married    Spouse name: Not on file  . Number of children: Not on file  . Years of education: Not on file  . Highest education level: Not on file  Occupational History  . Occupation: retired  Tobacco Use  . Smoking status: Never Smoker  . Smokeless tobacco: Never Used  Substance and Sexual Activity  . Alcohol use: No  . Drug use: No  . Sexual activity: Not on file  Other Topics Concern  . Not on file  Social History Narrative   Previously lived in [place]    Has twin sisters who live in the area    Social Determinants of Health   Financial Resource Strain: Low Risk   . Difficulty  of Paying Living Expenses: Not hard at all  Food Insecurity: No Food Insecurity  . Worried About Charity fundraiser in the Last Year: Never true  . Ran Out of Food in the Last Year: Never true  Transportation Needs: No Transportation Needs  . Lack of Transportation (Medical): No  . Lack of Transportation (Non-Medical): No  Physical Activity: Inactive  . Days of Exercise per Week: 0 days  . Minutes of Exercise per Session: 0 min  Stress: No Stress Concern Present  . Feeling of Stress : Not at all  Social Connections: Moderately Integrated  . Frequency of Communication with Friends and Family: More  than three times a week  . Frequency of Social Gatherings with Friends and Family: More than three times a week  . Attends Religious Services: 1 to 4 times per year  . Active Member of Clubs or Organizations: No  . Attends Archivist Meetings: Never  . Marital Status: Married    Tobacco Counseling Counseling given: Not Answered   Clinical Intake:  Pre-visit preparation completed: Yes  Pain : No/denies pain     BMI - recorded: 22.54 Nutritional Status: BMI of 19-24  Normal Nutritional Risks: None Diabetes: No  How often do you need to have someone help you when you read instructions, pamphlets, or other written materials from your doctor or pharmacy?: 1 - Never  Diabetic?no  Interpreter Needed?: No  Information entered by :: Charlott Rakes, LPN   Activities of Daily Living In your present state of health, do you have any difficulty performing the following activities: 06/07/2020  Hearing? N  Vision? N  Difficulty concentrating or making decisions? N  Walking or climbing stairs? N  Dressing or bathing? N  Doing errands, shopping? N  Preparing Food and eating ? N  Using the Toilet? N  In the past six months, have you accidently leaked urine? N  Do you have problems with loss of bowel control? N  Managing your Medications? N  Managing your Finances? N  Housekeeping or managing your Housekeeping? N  Some recent data might be hidden    Patient Care Team: Leamon Arnt, MD as PCP - General (Family Medicine)  Indicate any recent Medical Services you may have received from other than [facility] providers in the past year (date may be approximate).     Assessment:   This is a routine wellness examination for [place].  Hearing/Vision screen  Hearing Screening   125Hz  250Hz  500Hz  1000Hz  2000Hz  3000Hz  4000Hz  6000Hz  8000Hz   Right ear:           Left ear:           Comments: Denies any hearing issues  Vision Screening Comments: Pt follows up with [facility] for annual exams  Dietary issues and exercise activities discussed: Current Exercise Habits: The patient does not participate in regular exercise at present  Goals    . Patient Stated     Maintain health and enjoy time with family     . Patient Stated     Lose weight      Depression Screen PHQ 2/9 Scores 06/07/2020 02/14/2019 08/02/2018 06/02/2018  PHQ - 2 Score 0 0 0 0  PHQ- 9 Score - - - 0    Fall Risk Fall Risk  06/07/2020 06/13/2019 02/14/2019 08/02/2018 06/02/2018  Falls in the past year? 0 0 0 0 0  Number falls in past yr: 0 - 0 0 0  Injury with Fall?  0 - 0 0 0  Risk for fall due to : Impaired vision - - - -  Follow up Falls prevention discussed - Falls evaluation completed;Education provided;Falls prevention discussed - Falls evaluation completed    FALL RISK PREVENTION PERTAINING TO THE HOME:  Any stairs in or around the home? Yes  If so, are there any without handrails? No  Home free of loose throw rugs in walkways, pet beds, electrical cords, etc? Yes  Adequate lighting in your home to reduce risk of falls? Yes   ASSISTIVE DEVICES UTILIZED TO PREVENT FALLS:  Life alert? No  Use of a cane, walker or w/c? No  Grab bars in the bathroom? No  Shower chair or bench in shower? Yes  Elevated toilet seat or a handicapped toilet? No   TIMED UP AND GO:  Was the test performed? No .     Cognitive Function: MMSE - Mini Mental State Exam 02/14/2019  Orientation to time 5  Orientation to Place 5  Registration 3  Attention/ Calculation 5  Recall 3  Language- name 2 objects 2  Language- repeat 1  Language- follow 3 step command 3  Language- read & follow direction 1  Write a sentence 1  Copy design 1  Total score 30     6CIT Screen 06/07/2020  What Year? 0 points  What month? 0 points  Count back from 20 0 points  Months in reverse 0 points  Repeat phrase 0 points    Immunizations Immunization History  Administered Date(s) Administered  . Fluad  Quad(high Dose 65+) 02/14/2019, 02/11/2020  . Influenza, High Dose Seasonal PF 02/21/2015, 03/23/2016  . Influenza-Unspecified 02/21/2017  . PFIZER SARS-COV-2 Vaccination 06/16/2019, 07/07/2019, 02/11/2020  . Pneumococcal Conjugate-13 10/22/2015  . Pneumococcal Polysaccharide-23 03/24/2017  . Tdap 10/22/2015  . Zoster Recombinat (Shingrix) 06/02/2018, 08/01/2018    TDAP status: Up to date  Flu Vaccine status: Up to date  Pneumococcal vaccine status: Up to date  Covid-19 vaccine status: Completed vaccines  Qualifies for Shingles Vaccine? Yes   Zostavax completed Yes   Shingrix Completed?: Yes  Screening Tests Health Maintenance  Topic Date Due  . Hepatitis C Screening  Never done  . MAMMOGRAM  09/12/2020  . DEXA SCAN  09/12/2021  . TETANUS/TDAP  10/21/2025  . INFLUENZA VACCINE  Completed  . COVID-19 Vaccine  Completed  . PNA vac Low Risk Adult  Completed    Health Maintenance  Health Maintenance Due  Topic Date Due  . Hepatitis C Screening  Never done    Colorectal cancer screening: No longer required.   Mammogram status: Completed 09/13/19. Repeat every year  Bone Density status: Completed 09/13/19. Results reflect: Bone density results: OSTEOPENIA. Repeat every 2 years.   Additional Screening:  Vision Screening: Recommended annual ophthalmology exams for early detection of glaucoma and other disorders of the eye. Is the patient up to date with their annual eye exam?  Yes  Who is the provider or what is the name of the office in which the patient attends annual eye exams? [facility]    Dental Screening: Recommended annual dental exams for proper oral hygiene  Community Resource Referral / Chronic Care Management: CRR required this visit?  No   CCM required this visit?  No      Plan:     I have personally reviewed and noted the following in the patient's chart:   . Medical and social history . Use of alcohol, tobacco or illicit drugs   .  Current medications and supplements . Functional ability and status . Nutritional status . Physical activity . Advanced directives . List of other physicians . Hospitalizations, surgeries, and ER visits in previous 12 months . Vitals . Screenings to include cognitive, depression, and falls . Referrals and appointments  In addition, I have reviewed and discussed with patient certain preventive protocols, quality metrics, and best practice recommendations. A written personalized care plan for preventive services as well as general preventive health recommendations were provided to patient.     Willette Brace, LPN   [id number]   Nurse Notes: None

## 2020-06-13 ENCOUNTER — Other Ambulatory Visit: Payer: Self-pay

## 2020-06-13 ENCOUNTER — Ambulatory Visit (INDEPENDENT_AMBULATORY_CARE_PROVIDER_SITE_OTHER): Payer: Medicare Other | Admitting: Family Medicine

## 2020-06-13 ENCOUNTER — Encounter: Payer: Self-pay | Admitting: Family Medicine

## 2020-06-13 VITALS — BP 120/70 | HR 60 | Temp 97.4°F | Wt 138.4 lb

## 2020-06-13 DIAGNOSIS — Z79890 Hormone replacement therapy (postmenopausal): Secondary | ICD-10-CM

## 2020-06-13 DIAGNOSIS — Z1159 Encounter for screening for other viral diseases: Secondary | ICD-10-CM

## 2020-06-13 DIAGNOSIS — Z1211 Encounter for screening for malignant neoplasm of colon: Secondary | ICD-10-CM

## 2020-06-13 DIAGNOSIS — M8580 Other specified disorders of bone density and structure, unspecified site: Secondary | ICD-10-CM | POA: Diagnosis not present

## 2020-06-13 DIAGNOSIS — Z1212 Encounter for screening for malignant neoplasm of rectum: Secondary | ICD-10-CM

## 2020-06-13 DIAGNOSIS — E039 Hypothyroidism, unspecified: Secondary | ICD-10-CM | POA: Diagnosis not present

## 2020-06-13 DIAGNOSIS — Z0000 Encounter for general adult medical examination without abnormal findings: Secondary | ICD-10-CM

## 2020-06-13 DIAGNOSIS — F411 Generalized anxiety disorder: Secondary | ICD-10-CM

## 2020-06-13 LAB — CBC WITH DIFFERENTIAL/PLATELET
Basophils Absolute: 0 10*3/uL (ref 0.0–0.1)
Basophils Relative: 0.6 % (ref 0.0–3.0)
Eosinophils Absolute: 0.1 10*3/uL (ref 0.0–0.7)
Eosinophils Relative: 1.6 % (ref 0.0–5.0)
HCT: 44.5 % (ref 36.0–46.0)
Hemoglobin: 15.1 g/dL — ABNORMAL HIGH (ref 12.0–15.0)
Lymphocytes Relative: 29 % (ref 12.0–46.0)
Lymphs Abs: 1.7 10*3/uL (ref 0.7–4.0)
MCHC: 33.8 g/dL (ref 30.0–36.0)
MCV: 86.2 fl (ref 78.0–100.0)
Monocytes Absolute: 0.3 10*3/uL (ref 0.1–1.0)
Monocytes Relative: 4.4 % (ref 3.0–12.0)
Neutro Abs: 3.8 10*3/uL (ref 1.4–7.7)
Neutrophils Relative %: 64.4 % (ref 43.0–77.0)
Platelets: 249 10*3/uL (ref 150.0–400.0)
RBC: 5.16 Mil/uL — ABNORMAL HIGH (ref 3.87–5.11)
RDW: 13.6 % (ref 11.5–15.5)
WBC: 5.9 10*3/uL (ref 4.0–10.5)

## 2020-06-13 LAB — LIPID PANEL
Cholesterol: 259 mg/dL — ABNORMAL HIGH (ref 0–200)
HDL: 86.2 mg/dL (ref >39.00)
LDL Cholesterol: 155 mg/dL — ABNORMAL HIGH (ref 0–99)
NonHDL: 172.8
Total CHOL/HDL Ratio: 3
Triglycerides: 87 mg/dL (ref 0.0–149.0)
VLDL: 17.4 mg/dL (ref 0.0–40.0)

## 2020-06-13 LAB — COMPREHENSIVE METABOLIC PANEL
ALT: 9 U/L (ref 0–35)
AST: 16 U/L (ref 0–37)
Albumin: 4.5 g/dL (ref 3.5–5.2)
Alkaline Phosphatase: 107 U/L (ref 39–117)
BUN: 16 mg/dL (ref 6–23)
CO2: 32 mEq/L (ref 19–32)
Calcium: 9.8 mg/dL (ref 8.4–10.5)
Chloride: 101 mEq/L (ref 96–112)
Creatinine, Ser: 0.87 mg/dL (ref 0.40–1.20)
GFR: 63.75 mL/min (ref >60.00)
Glucose, Bld: 89 mg/dL (ref 70–99)
Potassium: 3.9 mEq/L (ref 3.5–5.1)
Sodium: 137 mEq/L (ref 135–145)
Total Bilirubin: 0.4 mg/dL (ref 0.2–1.2)
Total Protein: 7.2 g/dL (ref 6.0–8.3)

## 2020-06-13 LAB — TSH: TSH: 0.73 u[IU]/mL (ref 0.35–4.50)

## 2020-06-13 MED ORDER — SERTRALINE HCL 50 MG PO TABS: 50.0000 mg | ORAL_TABLET | Freq: Every day | ORAL | 3 refills | Status: DC

## 2020-06-13 MED ORDER — ESTRADIOL 1 MG PO TABS: 0.5000 mg | ORAL_TABLET | Freq: Every day | ORAL | 3 refills | Status: DC

## 2020-06-13 NOTE — Patient Instructions (Signed)
Please return in 12 months for your annual complete physical; please come fasting.  I will release your lab results to you on your MyChart account with further instructions. Please reply with any questions.   Please wean off of your hormones. You may no longer need them.   I recommend the Cologuard test for your colon cancer screening that is due. I have ordered this test for you. The [facility] will soon contact you to verify your insurance, address etc. They will then send you the kit; follow the instructions in the kit and return the kit to [facility]. They will run the test and send the results to me. I will then give you the results. If this test is negative, we recommend repeating a colon cancer screening test in 3 years. If it is positive, I will refer you to a Gastroenterologist so you can get set up for the recommended colonoscopy.  Thank you!  If you have any questions or concerns, please don't hesitate to send me a message via MyChart or call the office at [phone]. Thank you for visiting with Patricia Patrick today! It's our pleasure caring for you.   Preventive Care 5 Years and Older, Female Preventive care refers to lifestyle choices and visits with your health care provider that can promote health and wellness. This includes:  A yearly physical exam. This is also called an annual wellness visit.  Regular dental and eye exams.  Immunizations.  Screening for certain conditions.  Healthy lifestyle choices, such as: ? Eating a healthy diet. ? Getting regular exercise. ? Not using drugs or products that contain nicotine and tobacco. ? Limiting alcohol use. What can I expect for my preventive care visit? Physical exam Your health care provider will check your:  Height and weight. These may be used to calculate your BMI (body mass index). BMI is a measurement that tells if you are at a healthy weight.  Heart rate and blood pressure.  Body temperature.  Skin for abnormal  spots. Counseling Your health care provider may ask you questions about your:  Past medical problems.  Family's medical history.  Alcohol, tobacco, and drug use.  Emotional well-being.  Home life and relationship well-being.  Sexual activity.  Diet, exercise, and sleep habits.  History of falls.  Memory and ability to understand (cognition).  Work and work Statistician.  Pregnancy and menstrual history.  Access to firearms. What immunizations do I need? Vaccines are usually given at various ages, according to a schedule. Your health care provider will recommend vaccines for you based on your age, medical history, and lifestyle or other factors, such as travel or where you work.   What tests do I need? Blood tests  Lipid and cholesterol levels. These may be checked every 5 years, or more often depending on your overall health.  Hepatitis C test.  Hepatitis B test. Screening  Lung cancer screening. You may have this screening every year starting at age 19 if you have a 30-pack-year history of smoking and currently smoke or have quit within the past 15 years.  Colorectal cancer screening. ? All adults should have this screening starting at age 29 and continuing until age 61. ? Your health care provider may recommend screening at age 41 if you are at increased risk. ? You will have tests every 1-10 years, depending on your results and the type of screening test.  Diabetes screening. ? This is done by checking your blood sugar (glucose) after you have not eaten  for a while (fasting). ? You may have this done every 1-3 years.  Mammogram. ? This may be done every 1-2 years. ? Talk with your health care provider about how often you should have regular mammograms.  Abdominal aortic aneurysm (AAA) screening. You may need this if you are a current or former smoker.  BRCA-related cancer screening. This may be done if you have a family history of breast, ovarian, tubal, or  peritoneal cancers. Other tests  STD (sexually transmitted disease) testing, if you are at risk.  Bone density scan. This is done to screen for osteoporosis. You may have this done starting at age 31. Talk with your health care provider about your test results, treatment options, and if necessary, the need for more tests. Follow these instructions at home: Eating and drinking  Eat a diet that includes fresh fruits and vegetables, whole grains, lean protein, and low-fat dairy products. Limit your intake of foods with high amounts of sugar, saturated fats, and salt.  Take vitamin and mineral supplements as recommended by your health care provider.  Do not drink alcohol if your health care provider tells you not to drink.  If you drink alcohol: ? Limit how much you have to 0-1 drink a day. ? Be aware of how much alcohol is in your drink. In the [place] one drink equals one 12 oz bottle of beer (355 mL), one 5 oz glass of wine (148 mL), or one 1 oz glass of hard liquor (44 mL).   Lifestyle  Take daily care of your teeth and gums. Brush your teeth every morning and night with fluoride toothpaste. Floss one time each day.  Stay active. Exercise for at least 30 minutes 5 or more days each week.  Do not use any products that contain nicotine or tobacco, such as cigarettes, e-cigarettes, and chewing tobacco. If you need help quitting, ask your health care provider.  Do not use drugs.  If you are sexually active, practice safe sex. Use a condom or other form of protection in order to prevent STIs (sexually transmitted infections).  Talk with your health care provider about taking a low-dose aspirin or statin.  Find healthy ways to cope with stress, such as: ? Meditation, yoga, or listening to music. ? Journaling. ? Talking to a trusted person. ? Spending time with friends and family. Safety  Always wear your seat belt while driving or riding in a vehicle.  Do not drive: ? If you have  been drinking alcohol. Do not ride with someone who has been drinking. ? When you are tired or distracted. ? While texting.  Wear a helmet and other protective equipment during sports activities.  If you have firearms in your house, make sure you follow all gun safety procedures. What's next?  Visit your health care provider once a year for an annual wellness visit.  Ask your health care provider how often you should have your eyes and teeth checked.  Stay up to date on all vaccines. This information is not intended to replace advice given to you by your health care provider. Make sure you discuss any questions you have with your health care provider. Document Revised: 05/01/2020 Document Reviewed: 05/05/2018 Elsevier Patient Education  2021 [employer].

## 2020-06-13 NOTE — Progress Notes (Signed)
Subjective  Chief Complaint  Patient presents with  . Annual Exam    Fasting     HPI: Patricia Patrick is a 80 y.o. female who presents to [facility] today for a Female Wellness Visit. She also has the concerns and/or needs as listed above in the chief complaint. These will be addressed in addition to the Health Maintenance Visit.   Wellness Visit: annual visit with health maintenance review and exam without Pap   Health maintenance: Very healthy and active 80 year old female lives a healthy lifestyle. Feels well. Has no concerns. Overdue for colorectal cancer screening. She has never had a colonoscopy. There is no family history of colon cancer. She reports she had Cologuard or fecal occult screening done but that was more than 5 years ago. She declines colonoscopy.  Mammogram is normal and up-to-date. Reviewed recent bone density scan showing stable osteopenia with elevated FRAX score. She continues to exercise frequently and takes calcium and vitamin D. Eye exam is normal and up-to-date. Immunizations are all up-to-date.   Chronic disease f/u and/or acute problem visit: (deemed necessary to be done in addition to the wellness visit):  Hypothyroidism: She reports she takes her medicines daily. Levels have been stable for years. She has no symptoms of high or low thyroid.  Anxiety disorder remains well controlled on chronic Zoloft therapy. She does need refill. No adverse effects.  HRT: She was able to wean to 0.5 mg estradiol daily. She never had any recurrence of menopausal symptoms.   Assessment  1. Annual physical exam   2. Acquired hypothyroidism   3. GAD (generalized anxiety disorder)   4. Hormone replacement therapy (HRT)   5. Osteopenia, unspecified location      Plan  Female Wellness Visit:  Age appropriate Health Maintenance and Prevention measures were discussed with patient. Included topics are cancer screening recommendations, ways  to keep healthy (see AVS) including dietary and exercise recommendations, regular eye and dental care, use of seat belts, and avoidance of moderate alcohol use and tobacco use.   BMI: discussed patient's BMI and encouraged positive lifestyle modifications to help get to or maintain a target BMI.  HM needs and immunizations were addressed and ordered. See below for orders. See HM and immunization section for updates.  Routine labs and screening tests ordered including cmp, cbc and lipids where appropriate.  Discussed recommendations regarding Vit D and calcium supplementation (see AVS)  Chronic disease management visit and/or acute problem visit:  Hypothyroidism: Recheck TSH today. Clinically euthyroid. Will refill medications if stable.  Anxiety disorder remains well controlled. We'll continue Zoloft lifelong. Refilled today.  HRT: Again discussed risks and benefits. She will try to wean off completely. She likely no longer needs it. We discussed slow wean.  Osteopenia: Stable with moderately elevated FRAX score. Continue vitamin D and calcium and weightbearing exercise. Recheck next year.   Follow up: 12 months for complete physical No orders of the defined types were placed in this encounter.  No orders of the defined types were placed in this encounter.     Lifestyle: Body mass index is 22.34 kg/m. Wt Readings from Last 3 Encounters:  06/13/20 138 lb 6.4 oz (62.8 kg)  09/18/19 139 lb 9.6 oz (63.3 kg)  07/04/19 135 lb (61.2 kg)     Patient Active Problem List   Diagnosis Date Noted  . Hormone replacement therapy (HRT) 06/02/2018    Started during perimenopause.  Never stopped.   . Acquired hypothyroidism  06/02/2018  . GAD (generalized anxiety disorder) 06/02/2018    Reports history of panic attacks and anxiety: Unclear reasons.  Started on Zoloft and is started well.  Started around 2012.  Has never tried coming off of the medication.  Currently well controlled.   .  Panic attack 06/02/2018  . Osteopenia 06/02/2018    DEXA 05/2016 lowest T = -1.8 10 FRAC score 1.8%, major frac score 9.9%; rec recheck 2 years and calcium and vit D. DEXA 08/2019 osteopenia lowest T=-1.4, FRAX score major 15%; stable, recheck 2 years.     Health Maintenance  Topic Date Due  . Hepatitis C Screening  Never done  . MAMMOGRAM  09/12/2020  . DEXA SCAN  09/12/2021  . TETANUS/TDAP  10/21/2025  . INFLUENZA VACCINE  Completed  . COVID-19 Vaccine  Completed  . PNA vac Low Risk Adult  Completed   Immunization History  Administered Date(s) Administered  . Fluad Quad(high Dose 65+) 02/14/2019, 02/11/2020  . Influenza, High Dose Seasonal PF 02/21/2015, 03/23/2016  . Influenza-Unspecified 02/21/2017  . PFIZER(Purple Top)SARS-COV-2 Vaccination 06/16/2019, 07/07/2019, 02/11/2020  . Pneumococcal Conjugate-13 10/22/2015  . Pneumococcal Polysaccharide-23 03/24/2017  . Tdap 10/22/2015  . Zoster Recombinat (Shingrix) 06/02/2018, 08/01/2018   We updated and reviewed the patient's past history in detail and it is documented below. Allergies: Patient is allergic to lobster  [shellfish allergy]. Past Medical History Patient  has a past medical history of GAD (generalized anxiety disorder) (06/02/2018), Osteopenia (06/02/2018), and Thyroid disease. Past Surgical History Patient  has a past surgical history that includes Cholecystectomy (2009) and Abdominal hysterectomy (2009). Family History: Patient family history includes Cancer in her sister; Healthy in her son and son; Heart attack in her father; Obesity in her mother. Social History:  Patient  reports that she has never smoked. She has never used smokeless tobacco. She reports that she does not drink alcohol and does not use drugs.  Review of Systems: Constitutional: negative for fever or malaise Ophthalmic: negative for photophobia, double vision or loss of vision Cardiovascular: negative for chest pain, dyspnea on exertion, or new  LE swelling Respiratory: negative for SOB or persistent cough Gastrointestinal: negative for abdominal pain, change in bowel habits or melena Genitourinary: negative for dysuria or gross hematuria, no abnormal uterine bleeding or disharge Musculoskeletal: negative for new gait disturbance or muscular weakness Integumentary: negative for new or persistent rashes, no breast lumps Neurological: negative for TIA or stroke symptoms Psychiatric: negative for SI or delusions Allergic/Immunologic: negative for hives  Patient Care Team    Relationship Specialty Notifications Start End  Leamon Arnt, MD PCP - General Family Medicine  06/02/18     Objective  Vitals: BP 120/70   Pulse 60   Temp (!) 97.4 F (36.3 C) (Temporal)   Wt 138 lb 6.4 oz (62.8 kg)   SpO2 97%   BMI 22.34 kg/m  General:  Well developed, well nourished, no acute distress  Psych:  Alert and orientedx3,normal mood and affect HEENT:  Normocephalic, atraumatic, non-icteric sclera,  supple neck without adenopathy, mass or thyromegaly Cardiovascular:  Normal S1, S2, RRR without gallop, rub or murmur Respiratory:  Good breath sounds bilaterally, CTAB with normal respiratory effort Gastrointestinal: normal bowel sounds, soft, non-tender, no noted masses. No HSM MSK: no deformities, contusions. Joints are without erythema or swelling.  Skin:  Warm, no rashes or suspicious lesions noted Neurologic:    Mental status is normal. CN 2-11 are normal. Gross motor and sensory exams are normal. Normal gait. No  tremor Breast Exam: No mass, skin retraction or nipple discharge is appreciated in either breast. No axillary adenopathy. Fibrocystic changes are not noted    Commons side effects, risks, benefits, and alternatives for medications and treatment plan prescribed today were discussed, and the patient expressed understanding of the given instructions. Patient is instructed to call or message via MyChart if he/she has any questions or  concerns regarding our treatment plan. No barriers to understanding were identified. We discussed Red Flag symptoms and signs in detail. Patient expressed understanding regarding what to do in case of urgent or emergency type symptoms.   Medication list was reconciled, printed and provided to the patient in AVS. Patient instructions and summary information was reviewed with the patient as documented in the AVS. This note was prepared with assistance of Dragon voice recognition software. Occasional wrong-word or sound-a-like substitutions may have occurred due to the inherent limitations of voice recognition software  This visit occurred during the SARS-CoV-2 public health emergency.  Safety protocols were in place, including screening questions prior to the visit, additional usage of staff PPE, and extensive cleaning of exam room while observing appropriate contact time as indicated for disinfecting solutions.

## 2020-06-14 LAB — HEPATITIS C ANTIBODY
Hepatitis C Ab: NONREACTIVE
SIGNAL TO CUT-OFF: 0 (ref <1.00)

## 2020-06-17 ENCOUNTER — Other Ambulatory Visit: Payer: Self-pay

## 2020-06-17 MED ORDER — ATORVASTATIN CALCIUM 10 MG PO TABS: 10.0000 mg | ORAL_TABLET | Freq: Every day | ORAL | 3 refills | Status: DC

## 2020-10-17 ENCOUNTER — Telehealth: Payer: Self-pay

## 2020-10-17 ENCOUNTER — Encounter (HOSPITAL_BASED_OUTPATIENT_CLINIC_OR_DEPARTMENT_OTHER): Payer: Self-pay | Admitting: Emergency Medicine

## 2020-10-17 ENCOUNTER — Emergency Department (HOSPITAL_BASED_OUTPATIENT_CLINIC_OR_DEPARTMENT_OTHER): Payer: Medicare Other

## 2020-10-17 ENCOUNTER — Emergency Department (HOSPITAL_BASED_OUTPATIENT_CLINIC_OR_DEPARTMENT_OTHER)
Admission: EM | Admit: 2020-10-17 | Discharge: 2020-10-17 | Disposition: A | Discharge status: Home / Self Care | Payer: Medicare Other | Attending: Emergency Medicine | Admitting: Emergency Medicine

## 2020-10-17 ENCOUNTER — Other Ambulatory Visit: Payer: Self-pay

## 2020-10-17 DIAGNOSIS — R911 Solitary pulmonary nodule: Secondary | ICD-10-CM | POA: Diagnosis not present

## 2020-10-17 DIAGNOSIS — R002 Palpitations: Secondary | ICD-10-CM | POA: Diagnosis not present

## 2020-10-17 DIAGNOSIS — R42 Dizziness and giddiness: Secondary | ICD-10-CM | POA: Insufficient documentation

## 2020-10-17 DIAGNOSIS — Z20822 Contact with and (suspected) exposure to covid-19: Secondary | ICD-10-CM | POA: Insufficient documentation

## 2020-10-17 DIAGNOSIS — R1013 Epigastric pain: Secondary | ICD-10-CM | POA: Diagnosis not present

## 2020-10-17 DIAGNOSIS — K449 Diaphragmatic hernia without obstruction or gangrene: Secondary | ICD-10-CM | POA: Insufficient documentation

## 2020-10-17 DIAGNOSIS — R0789 Other chest pain: Secondary | ICD-10-CM | POA: Diagnosis not present

## 2020-10-17 DIAGNOSIS — N200 Calculus of kidney: Secondary | ICD-10-CM | POA: Diagnosis not present

## 2020-10-17 DIAGNOSIS — R000 Tachycardia, unspecified: Secondary | ICD-10-CM | POA: Diagnosis not present

## 2020-10-17 DIAGNOSIS — J9811 Atelectasis: Secondary | ICD-10-CM | POA: Diagnosis not present

## 2020-10-17 LAB — TROPONIN I (HIGH SENSITIVITY)
Troponin I (High Sensitivity): 3 ng/L (ref <18)
Troponin I (High Sensitivity): 3 ng/L (ref <18)

## 2020-10-17 LAB — RESP PANEL BY RT-PCR (FLU A&B, COVID) ARPGX2
Influenza A by PCR: NEGATIVE
Influenza B by PCR: NEGATIVE
SARS Coronavirus 2 by RT PCR: NEGATIVE

## 2020-10-17 LAB — CBC WITH DIFFERENTIAL/PLATELET
Abs Immature Granulocytes: 0.02 10*3/uL (ref 0.00–0.07)
Basophils Absolute: 0.1 10*3/uL (ref 0.0–0.1)
Basophils Relative: 1 %
Eosinophils Absolute: 0.1 10*3/uL (ref 0.0–0.5)
Eosinophils Relative: 1 %
HCT: 46.8 % — ABNORMAL HIGH (ref 36.0–46.0)
Hemoglobin: 15.6 g/dL — ABNORMAL HIGH (ref 12.0–15.0)
Immature Granulocytes: 0 %
Lymphocytes Relative: 24 %
Lymphs Abs: 1.5 10*3/uL (ref 0.7–4.0)
MCH: 29.5 pg (ref 26.0–34.0)
MCHC: 33.3 g/dL (ref 30.0–36.0)
MCV: 88.6 fL (ref 80.0–100.0)
Monocytes Absolute: 0.2 10*3/uL (ref 0.1–1.0)
Monocytes Relative: 4 %
Neutro Abs: 4.5 10*3/uL (ref 1.7–7.7)
Neutrophils Relative %: 70 %
Platelets: 202 10*3/uL (ref 150–400)
RBC: 5.28 MIL/uL — ABNORMAL HIGH (ref 3.87–5.11)
RDW: 12.3 % (ref 11.5–15.5)
WBC: 6.4 10*3/uL (ref 4.0–10.5)
nRBC: 0 % (ref 0.0–0.2)

## 2020-10-17 LAB — D-DIMER, QUANTITATIVE: D-Dimer, Quant: 0.69 ug/mL-FEU — ABNORMAL HIGH (ref 0.00–0.50)

## 2020-10-17 LAB — T4, FREE: Free T4: 0.84 ng/dL (ref 0.61–1.12)

## 2020-10-17 LAB — BASIC METABOLIC PANEL
Anion gap: 8 (ref 5–15)
BUN: 14 mg/dL (ref 8–23)
CO2: 28 mmol/L (ref 22–32)
Calcium: 8.8 mg/dL — ABNORMAL LOW (ref 8.9–10.3)
Chloride: 103 mmol/L (ref 98–111)
Creatinine, Ser: 0.82 mg/dL (ref 0.44–1.00)
GFR, Estimated: >60 mL/min (ref >60)
Glucose, Bld: 88 mg/dL (ref 70–99)
Potassium: 4.2 mmol/L (ref 3.5–5.1)
Sodium: 139 mmol/L (ref 135–145)

## 2020-10-17 LAB — MAGNESIUM: Magnesium: 2.2 mg/dL (ref 1.7–2.4)

## 2020-10-17 LAB — TSH: TSH: 0.92 u[IU]/mL (ref 0.350–4.500)

## 2020-10-17 MED ORDER — FAMOTIDINE 20 MG PO TABS: 20.0000 mg | ORAL_TABLET | Freq: Two times a day (BID) | ORAL | 2 refills | Status: DC

## 2020-10-17 MED ORDER — OMEPRAZOLE 20 MG PO CPDR: 20.0000 mg | DELAYED_RELEASE_CAPSULE | Freq: Every day | ORAL | 1 refills | Status: DC

## 2020-10-17 MED ORDER — IOHEXOL 350 MG/ML SOLN
60.0000 mL | Freq: Once | INTRAVENOUS | Status: AC | PRN
  Administered 2020-10-17: 60 mL via INTRAVENOUS

## 2020-10-17 MED ORDER — METOPROLOL TARTRATE 25 MG PO TABS: 12.5000 mg | ORAL_TABLET | Freq: Two times a day (BID) | ORAL | 0 refills | Status: DC | PRN

## 2020-10-17 NOTE — Telephone Encounter (Signed)
FYI

## 2020-10-17 NOTE — ED Notes (Signed)
States been having episodes of rapid heart rate/  palpations about 2 to 3 times.  Denies SOB of chest pain.  States when happens "doesn't feel right"

## 2020-10-17 NOTE — ED Provider Notes (Signed)
[facility] DE[facility] Provider Note   CSN: [id number] Arrival date & time: 10/17/20  1157     History CC:  Palpitations  Patricia SCARBROUGH is a 80 y.o. female with a history of hypothyroidism presenting the emergency department with palpitations.  The patient reports that she has been experiencing episodes of palpitations for the past 2 to 3 days.  Prior to that she had an episode about 3 weeks ago which resolved on its own after a few minutes.  She describes episodes as heart fluttering in her chest.  She says she feels "not right" when this happens.  She describes perhaps some very mild lightheadedness with the symptoms.  The most recently occurred earlier this morning while at rest.  She currently does not feel that she is having persistent fluttering, but states "I can feel something is off".  She denies any chest pain or pressure.  She denies any radiation of symptoms down her arms or into her jaw.  She denies any nausea, vomiting, diaphoresis, diarrhea.  She says she has been in her normal state of health the past 2 weeks.  She has had 2 doses of the COVID-vaccine and the booster.  She denies consumption of caffeine.  She denies any excessive alcohol consumption, only very light periodic drinker.  She denies any known history of heart disease herself, or any history of arrhythmia.    She reports she was started on statin recently, but denies any other medications.  She denies any personal significant cardiac hx, or hx of diabetes.  She is on cholesterol medicine.  No significant family hx of MI at young age.  She reports that she is on hormone replacement therapy which was initiated when she was going through menopause, but she continues with estradiol, which her PCP is attempting to wean her off.  She denies any history of DVT or PE prior to this.  HPI     Past Medical History:  Diagnosis Date  . GAD (generalized anxiety disorder) 06/02/2018  . Osteopenia 06/02/2018    DEXA 05/2016 lowest T = -1.8 10 FRAC score 1.8%, major frac score 9.9%; rec recheck 2 years and calcium and vit D.  . Thyroid disease     Patient Active Problem List   Diagnosis Date Noted  . Hormone replacement therapy (HRT) 06/02/2018  . Acquired hypothyroidism 06/02/2018  . GAD (generalized anxiety disorder) 06/02/2018  . Panic attack 06/02/2018  . Osteopenia 06/02/2018    Past Surgical History:  Procedure Laterality Date  . ABDOMINAL HYSTERECTOMY  2009  . CHOLECYSTECTOMY  2009     OB History   No obstetric history on file.     Family History  Problem Relation Age of Onset  . Obesity Mother   . Heart attack Father   . Cancer Sister   . Healthy Son   . Healthy Son     Social History   Tobacco Use  . Smoking status: Never Smoker  . Smokeless tobacco: Never Used  Substance Use Topics  . Alcohol use: Yes  . Drug use: No    Home Medications Prior to Admission medications   Medication Sig Start Date End Date Taking? Authorizing Provider  famotidine (PEPCID) 20 MG tablet Take 1 tablet (20 mg total) by mouth 2 (two) times daily. Takes 30 minutes before breakfast and diner 10/17/20 11/16/20 Yes Lylie Blacklock, Carola Rhine, MD  metoprolol tartrate (LOPRESSOR) 25 MG tablet Take 0.5 tablets (12.5 mg total) by mouth every 12 (twelve) hours as  needed for up to 30 doses. 10/17/20  Yes Wyvonnia Dusky, MD  omeprazole (PRILOSEC) 20 MG capsule Take 1 capsule (20 mg total) by mouth daily for 30 doses. 10/17/20 11/16/20 Yes Sindi Beckworth, Carola Rhine, MD  atorvastatin (LIPITOR) 10 MG tablet Take 1 tablet (10 mg total) by mouth daily. 06/17/20   Leamon Arnt, MD  estradiol (ESTRACE) 1 MG tablet Take 0.5 tablets (0.5 mg total) by mouth daily. 06/13/20   Leamon Arnt, MD  levothyroxine (SYNTHROID) 75 MCG tablet TAKE 1 TABLET(75 MCG) BY MOUTH DAILY 05/06/20   Leamon Arnt, MD  sertraline (ZOLOFT) 50 MG tablet Take 1 tablet (50 mg total) by mouth daily. 06/13/20   Leamon Arnt, MD    Allergies     Vassie Loll allergy]  Review of Systems   Review of Systems  Constitutional: Negative for chills and fever.  Eyes: Negative for photophobia and visual disturbance.  Respiratory: Negative for cough and shortness of breath.   Cardiovascular: Positive for palpitations. Negative for chest pain.  Gastrointestinal: Negative for abdominal pain, nausea and vomiting.  Genitourinary: Negative for dysuria and hematuria.  Musculoskeletal: Negative for arthralgias and back pain.  Skin: Negative for color change and rash.  Neurological: Positive for light-headedness. Negative for syncope.  All other systems reviewed and are negative.   Physical Exam Updated Vital Signs BP (!) 158/68   Pulse (!) 56   Temp 98.4 F (36.9 C) (Oral)   Resp 17   Ht 5\' 6"  (1.676 m)   Wt 63.5 kg   SpO2 100%   BMI 22.60 kg/m   Physical Exam Constitutional:      General: She is not in acute distress. HENT:     Head: Normocephalic and atraumatic.  Eyes:     Conjunctiva/sclera: Conjunctivae normal.     Pupils: Pupils are equal, round, and reactive to light.  Cardiovascular:     Rate and Rhythm: Normal rate and regular rhythm.     Pulses: Normal pulses.     Comments: Occasional extra beat/ PVC on monitor Pulmonary:     Effort: Pulmonary effort is normal. No respiratory distress.  Abdominal:     General: There is no distension.     Tenderness: There is no abdominal tenderness.  Skin:    General: Skin is warm and dry.  Neurological:     General: No focal deficit present.     Mental Status: She is alert and oriented to person, place, and time. Mental status is at baseline.     Sensory: No sensory deficit.     Motor: No weakness.  Psychiatric:        Mood and Affect: Mood normal.        Behavior: Behavior normal.     ED Results / Procedures / Treatments   Labs (all labs ordered are listed, but only abnormal results are displayed) Labs Reviewed  BASIC METABOLIC PANEL - Abnormal; Notable for  the following components:      Result Value   Calcium 8.8 (*)    All other components within normal limits  CBC WITH DIFFERENTIAL/PLATELET - Abnormal; Notable for the following components:   RBC 5.28 (*)    Hemoglobin 15.6 (*)    HCT 46.8 (*)    All other components within normal limits  D-DIMER, QUANTITATIVE - Abnormal; Notable for the following components:   D-Dimer, Quant 0.69 (*)    All other components within normal limits  RESP PANEL BY RT-PCR (FLU A&B, COVID)  ARPGX2  TSH  T4, FREE  MAGNESIUM  TROPONIN I (HIGH SENSITIVITY)  TROPONIN I (HIGH SENSITIVITY)    EKG EKG Interpretation  Date/Time:  Thursday Oct 17 2020 12:04:29 EDT Ventricular Rate:  69 PR Interval:  181 QRS Duration: 97 QT Interval:  377 QTC Calculation: 404 R Axis:   76 Text Interpretation: Sinus arrhythmia Confirmed by Octaviano Glow [id number]) on 10/17/2020 12:05:52 PM   Radiology CT Angio Chest PE W and/or Wo Contrast  Result Date: 10/17/2020 CLINICAL DATA:  80 year old female with high probability suspicion for pulmonary embolus EXAM: CT ANGIOGRAPHY CHEST WITH CONTRAST TECHNIQUE: Multidetector CT imaging of the chest was performed using the standard protocol during bolus administration of intravenous contrast. Multiplanar CT image reconstructions and MIPs were obtained to evaluate the vascular anatomy. CONTRAST:  3mL OMNIPAQUE IOHEXOL 350 MG/ML SOLN COMPARISON:  None. FINDINGS: Cardiovascular: Satisfactory opacification of the pulmonary arteries to the segmental level. No evidence of pulmonary embolism. Normal heart size. No pericardial effusion. Calcified atherosclerotic plaque tracking along the left anterior descending coronary artery. Mediastinum/Nodes: Unremarkable CT appearance of the thyroid gland. No suspicious mediastinal or hilar adenopathy. No soft tissue mediastinal mass. Small hiatal hernia. Lungs/Pleura: Mild dependent atelectasis. Nonspecific 0.9 x 0.7 cm semi solid nodule in the right lung apex  (image 30 series 6). Approximately 1 cm nodular opacity in the periphery of the right upper lobe (image 74 series 6) with adjacent tree-in-bud micro nodularity likely represents inspissated secretions within bronchials. The lungs are otherwise clear. Upper Abdomen: Punctate nonobstructing stone in the interpolar right renal collecting system. Gallbladder is surgically absent. No acute abnormality within the upper abdomen. Musculoskeletal: No acute fracture or aggressive appearing lytic or blastic osseous lesion. Review of the MIP images confirms the above findings. IMPRESSION: 1. Negative for acute pulmonary embolus, pneumonia or other acute cardiopulmonary process. 2. Nonspecific 0.9 cm semi solid nodule in the right lung apex. Initial follow-up with CT at 6-12 months is recommended to confirm persistence. If persistent, repeat CT is recommended every 2 years until 5 years of stability has been established. This recommendation follows the consensus statement: Guidelines for Management of Incidental Pulmonary Nodules Detected on CT Images:From the Fleischner Society 2017; published online before print (10.1148/[id number]). 3. Cluster of nodular opacities in the periphery of the right upper lobe likely represents sequelae of a prior infectious/inflammatory process or an indolent atypical infectious/inflammatory process such as MAI. 4. Coronary artery calcifications. 5. Small hiatal hernia. 6. Nonobstructing right-sided nephrolithiasis. Electronically Signed   By: Jacqulynn Cadet M.D.   On: 10/17/2020 15:22   DG Chest Portable 1 View  Result Date: 10/17/2020 CLINICAL DATA:  Tachycardia. EXAM: PORTABLE CHEST 1 VIEW COMPARISON:  None. FINDINGS: The heart size and mediastinal contours are within normal limits. Both lungs are clear. The visualized skeletal structures are unremarkable. IMPRESSION: No active disease. Electronically Signed   By: Marijo Conception M.D.   On: 10/17/2020 12:54     Procedures Procedures   Medications Ordered in ED Medications  iohexol (OMNIPAQUE) 350 MG/ML injection 60 mL (60 mLs Intravenous Contrast Given 10/17/20 1454)    ED Course  I have reviewed the triage vital signs and the nursing notes.  Pertinent labs & imaging results that were available during my care of the patient were reviewed by me and considered in my medical decision making (see chart for details).  This is a well-appearing 80 year old female presenting to the ER with concern for palpitations.  She has occasional PVCs on her cardiac monitor, but otherwise  it is a sinus arrhythmia.  Will need to check her electrolyte levels.  Can also send off thyroid levels.  I doubt she has any thyroid storm.  A lower overall suspicion for bacterial infection or sepsis.  We did discuss a COVID test which she is agreeable to.  We will also check for anemia.  I personally reviewed her EKG, this shows no acute ischemia or evidence of heart block.  She does have a sinus arrhythmia.  *  I personally ordered and reviewed her labs and imaging, which were notable for normal Mg 2.2 and K at 4.2.  CBC unremarkable.  No acute anemia.  WBC normal. DG chest with no focal infiltrates.  *  Labs and telemetry reviewed.  There are no remarkable findings on his work-up.  Telemetry is showing occasional PVCs, no true A. fib.  She continues to have a strange sensation of epigastric and chest discomfort, mild palpitation.  We discussed blood clot work-up given that she is on estrogen hormone replacement therapy.  Both she and her husband, who is present at bedside, agree with this work-up.  We will start with a D-dimer.  If this is positive she may need a follow-up CT PE scan.  Otherwise she appears stable at this time.  She denies any active chest pain.  *  Following CT PE results, I discussed with the patient and her husband the workup.  Incidental findings of pulmonary nodule needing PCP f/u -  instructions provided.  Hiatal hernia noted, and may be contributing to her epigastric discomfort, as it correlates with the location of her discomfort.  We can start on PPI for this, and PCP can further f/u on this issue.  She still needs cardiology referral to possible arrhythmia and cardiac evaluation, with noted calcifications on her ECG.  However with a benign ECG, normal troponins here, I have a lower suspicion for ACS at this time.  Her HEART score is 3 per age and risk factors, placing her in a low overall risk for MACE.  Careful return precautions were discussed with the patient and her husband.    Clinical Course as of 10/17/20 1752  Thu Oct 17, 2020  1314 Will try to add on troponin level [MT]  1314 IMPRESSION: No active disease. [MT]  1530 IMPRESSION: 1. Negative for acute pulmonary embolus, pneumonia or other acute cardiopulmonary process. 2. Nonspecific 0.9 cm semi solid nodule in the right lung apex. Initial follow-up with CT at 6-12 months is recommended to confirm persistence. If persistent, repeat CT is recommended every 2 years until 5 years of stability has been established. This recommendation follows the consensus statement: Guidelines for Management of Incidental Pulmonary Nodules Detected on CT Images:From the Fleischner Society 2017; published online before print (10.1148/radiol.[id number]). 3. Cluster of nodular opacities in the periphery of the right upper lobe likely represents sequelae of a prior infectious/inflammatory process or an indolent atypical infectious/inflammatory process such as MAI. 4. Coronary artery calcifications. 5. Small hiatal hernia. 6. Nonobstructing right-sided nephrolithiasis [MT]    Clinical Course User Index [MT] Diante Barley, Carola Rhine, MD    Final Clinical Impression(s) / ED Diagnoses Final diagnoses:  Palpitations  Pulmonary nodule  Hiatal hernia    Rx / DC Orders ED Discharge Orders         Ordered    Ambulatory referral  to Cardiology       Comments: Palpitations, seen in ED   10/17/20 1534    metoprolol tartrate (LOPRESSOR) 25 MG tablet  Every 12 hours PRN        10/17/20 1555    omeprazole (PRILOSEC) 20 MG capsule  Daily        10/17/20 1555    famotidine (PEPCID) 20 MG tablet  2 times daily        10/17/20 1555           Wyvonnia Dusky, MD 10/17/20 1754

## 2020-10-17 NOTE — ED Notes (Signed)
Patient verbalizes understanding of discharge instructions. Opportunity for questioning and answers were provided. Armband removed by staff, pt discharged from ED.  

## 2020-10-17 NOTE — Telephone Encounter (Signed)
Patient is currently in ED.  Initial Comment Caller has racing heart Translation No Disp. Time Eilene Ghazi Time) Disposition Final User 10/17/2020 10:48:49 AM Attempt made - message left Suzette Battiest 10/17/2020 10:58:43 AM Attempt made - message left Claiborne Billings, RN, Maudie Mercury 10/17/2020 11:08:51 AM FINAL ATTEMPT MADE - message left Yes Claiborne Billings, RN, KiInitial Comment Caller has racing heart

## 2020-10-17 NOTE — ED Triage Notes (Signed)
States she can feel her heart beating fast  Since last 2 days , no n/v/sob

## 2020-10-17 NOTE — Discharge Instructions (Signed)
Please call your primary care doctor's office to arrange a follow up appointment for the CT scan findings today.  These include the pulmonary nodule, hiatal hernia, and other issues noted on CT scan.  The scan report includes guidelines for repeat imaging for your pulmonary nodule.  I printed a report for you to bring with you.  I also started you on 2 medications for your hiatal hernia.  This condition can cause people to have early feeling of fullness, as well as reflux discomfort in her chest.  Please take these medications as prescribed (omeprazole and famotidine).  I also placed a referral to cardiology for your palpitations.  We talked about the fact that you may need further work-up in the office with a heart specialist.  I prescribed you a medication called metoprolol.  You can take half a tablet as needed for palpitations at home.    If you ever feel lightheaded, feel like passing out, have difficulty breathing, or sudden new chest pain, please call 911 or return to the ER immediately.  *  2. Nonspecific 0.9 cm semi solid nodule in the right lung apex. Initial follow-up with CT at 6-12 months is recommended to confirm persistence. If persistent, repeat CT is recommended every 2 years until 5 years of stability has been established. This recommendation follows the consensus statement: Guidelines for Management of Incidental Pulmonary Nodules Detected on CT Images:From the Fleischner Society 2017; published online before print (10.1148/radiol.2395320233).

## 2020-10-22 DIAGNOSIS — C44722 Squamous cell carcinoma of skin of right lower limb, including hip: Secondary | ICD-10-CM | POA: Diagnosis not present

## 2020-10-22 DIAGNOSIS — Z85828 Personal history of other malignant neoplasm of skin: Secondary | ICD-10-CM | POA: Diagnosis not present

## 2020-10-22 DIAGNOSIS — L821 Other seborrheic keratosis: Secondary | ICD-10-CM | POA: Diagnosis not present

## 2020-10-22 DIAGNOSIS — L820 Inflamed seborrheic keratosis: Secondary | ICD-10-CM | POA: Diagnosis not present

## 2020-10-22 DIAGNOSIS — L570 Actinic keratosis: Secondary | ICD-10-CM | POA: Diagnosis not present

## 2020-10-31 ENCOUNTER — Encounter: Payer: Self-pay | Admitting: Family Medicine

## 2020-10-31 ENCOUNTER — Ambulatory Visit (INDEPENDENT_AMBULATORY_CARE_PROVIDER_SITE_OTHER): Payer: Medicare Other | Admitting: Family Medicine

## 2020-10-31 ENCOUNTER — Other Ambulatory Visit: Payer: Self-pay

## 2020-10-31 VITALS — BP 127/70 | HR 60 | Temp 98.0°F | Ht 66.0 in | Wt 141.4 lb

## 2020-10-31 DIAGNOSIS — N200 Calculus of kidney: Secondary | ICD-10-CM | POA: Diagnosis not present

## 2020-10-31 DIAGNOSIS — R319 Hematuria, unspecified: Secondary | ICD-10-CM | POA: Diagnosis not present

## 2020-10-31 LAB — POCT URINALYSIS DIPSTICK
Bilirubin, UA: NEGATIVE
Glucose, UA: NEGATIVE
Ketones, UA: NEGATIVE
Nitrite, UA: NEGATIVE
Protein, UA: POSITIVE — AB
Spec Grav, UA: 1.015 (ref 1.010–1.025)
Urobilinogen, UA: 1 E.U./dL
pH, UA: 6 (ref 5.0–8.0)

## 2020-10-31 NOTE — Patient Instructions (Signed)
It was very nice to see you today!  Am glad that your symptoms are improving.  You should hopefully not have any issues with this going forward.  If you feel like you have a recurrence of kidney stones please start straining your urine and let us know.  Take care, Dr Jerline Pain  PLEASE NOTE:  If you had any lab tests please let us know if you have not heard back within a few days. You may see your results on mychart before we have a chance to review them but we will give you a call once they are reviewed by [place]. If we ordered any referrals today, please let us know if you have not heard from their office within the next week.   Please try these tips to maintain a healthy lifestyle:  Eat at least 3 REAL meals and 1-2 snacks per day.  Aim for no more than 5 hours between eating.  If you eat breakfast, please do so within one hour of getting up.   Each meal should contain half fruits/vegetables, one quarter protein, and one quarter carbs (no bigger than a computer mouse)  Cut down on sweet beverages. This includes juice, soda, and sweet tea.   Drink at least 1 glass of water with each meal and aim for at least 8 glasses per day  Exercise at least 150 minutes every week.

## 2020-10-31 NOTE — Progress Notes (Signed)
   Patricia Patrick is a 80 y.o. female who presents today for an office visit.  Assessment/Plan:  Hematuria/nephrolithiasis Still has some blood on her UA but no longer has a discomfort and symptoms seem to be improving.  She was incidentally noted to have small nephrolithiasis on CT scan in the ED a couple weeks ago.  I personally reviewed these images with patient today.  Given that symptoms have resolved, do not think we need to pursue any further work-up at this point.  UA positive for small amount of blood today.  We will check culture to rule out UTI.  Advised patient to let us know if her symptoms return.  Also provided patient with a urine strainer today in case symptoms return.  Discussed reasons return to care.     Subjective:  HPI:  Patient here with hematuria.  Started a few days ago.  Patient has a couple of small kidney stones.  She has never had any issues with kidney stones in the past.  Had some pain discomfort initially but that is essentially resolved.  She has not noticed any further episodes of blood in urine either.  No specific treatments tried.       Objective:  Physical Exam: BP 127/70   Pulse 60   Temp 98 F (36.7 C) (Temporal)   Ht 5\' 6"  (1.676 m)   Wt 141 lb 6.4 oz (64.1 kg)   SpO2 97%   BMI 22.82 kg/m   Gen: No acute distress, resting comfortably Neuro: Grossly normal, moves all extremities Psych: Normal affect and thought content      Patricia Patrick M. Jerline Pain, MD 10/31/2020 2:50 PM

## 2020-11-02 ENCOUNTER — Other Ambulatory Visit: Payer: Self-pay | Admitting: Family Medicine

## 2020-11-02 ENCOUNTER — Encounter: Payer: Self-pay | Admitting: Family Medicine

## 2020-11-02 LAB — URINE CULTURE
MICRO NUMBER:: 11988933
SPECIMEN QUALITY:: ADEQUATE

## 2020-11-04 ENCOUNTER — Other Ambulatory Visit: Payer: Self-pay

## 2020-11-04 DIAGNOSIS — R319 Hematuria, unspecified: Secondary | ICD-10-CM

## 2020-11-04 MED ORDER — NITROFURANTOIN MONOHYD MACRO 100 MG PO CAPS: 100.0000 mg | ORAL_CAPSULE | Freq: Two times a day (BID) | ORAL | 0 refills | Status: DC

## 2020-11-04 NOTE — Progress Notes (Signed)
Please inform patient of the following:  Urine culture showed UTI.  Recommend starting Macrobid 100 mg twice daily for 5 days.  Would like for her to come back in 1 to 2 weeks to recheck UA and urine culture to make sure the blood in her urine has cleared up.

## 2020-11-04 NOTE — Telephone Encounter (Signed)
I spoke with the pt to go over Lab result note from Dr. Jerline Pain. Px for Macrobid was sent to pharmacy. Pt voiced understanding.

## 2020-11-13 ENCOUNTER — Other Ambulatory Visit (INDEPENDENT_AMBULATORY_CARE_PROVIDER_SITE_OTHER): Payer: Medicare Other

## 2020-11-13 DIAGNOSIS — R319 Hematuria, unspecified: Secondary | ICD-10-CM | POA: Diagnosis not present

## 2020-11-13 LAB — POCT URINALYSIS DIPSTICK
Bilirubin, UA: NEGATIVE
Blood, UA: POSITIVE
Glucose, UA: NEGATIVE
Ketones, UA: NEGATIVE
Nitrite, UA: NEGATIVE
Protein, UA: NEGATIVE
Spec Grav, UA: 1.015 (ref 1.010–1.025)
Urobilinogen, UA: 0.2 E.U./dL
pH, UA: 6 (ref 5.0–8.0)

## 2020-11-15 LAB — URINE CULTURE
MICRO NUMBER:: 12037194
SPECIMEN QUALITY:: ADEQUATE

## 2020-11-15 NOTE — Progress Notes (Signed)
Please inform patient of the following:  She stillhas a UTI. Recommend we start another antibiotic. Please send in cipro 500mg  daily x 3 days. Would like for her to come back in 1-2 weeks to recheck.  Algis Greenhouse. Jerline Pain, MD 11/15/2020 3:16 PM

## 2020-11-18 ENCOUNTER — Other Ambulatory Visit: Payer: Medicare Other

## 2020-11-18 ENCOUNTER — Other Ambulatory Visit: Payer: Self-pay

## 2020-11-18 MED ORDER — CIPROFLOXACIN HCL 500 MG PO TABS: 500.0000 mg | ORAL_TABLET | Freq: Two times a day (BID) | ORAL | 0 refills | Status: DC

## 2020-12-05 ENCOUNTER — Telehealth: Payer: Self-pay

## 2020-12-05 NOTE — Telephone Encounter (Signed)
Please call pt she needs an appt to be evaluated in order to get medication.

## 2020-12-05 NOTE — Telephone Encounter (Signed)
Patient is calling in stating she is having a hard time with kidney stones, and missed a call from a nurse (didn't see documentation) so she can get medication. No open appointments at our office until Monday but doesn't want to go to another office.

## 2020-12-06 ENCOUNTER — Other Ambulatory Visit: Payer: Self-pay

## 2020-12-06 ENCOUNTER — Encounter: Payer: Self-pay | Admitting: Family Medicine

## 2020-12-06 ENCOUNTER — Ambulatory Visit (INDEPENDENT_AMBULATORY_CARE_PROVIDER_SITE_OTHER): Payer: Medicare Other | Admitting: Family Medicine

## 2020-12-06 VITALS — BP 148/80 | HR 60 | Temp 97.6°F | Ht 66.0 in | Wt 138.4 lb

## 2020-12-06 DIAGNOSIS — R109 Unspecified abdominal pain: Secondary | ICD-10-CM | POA: Diagnosis not present

## 2020-12-06 LAB — POCT URINALYSIS DIPSTICK
Bilirubin, UA: NEGATIVE
Glucose, UA: NEGATIVE
Ketones, UA: NEGATIVE
Nitrite, UA: NEGATIVE
Protein, UA: NEGATIVE
Spec Grav, UA: 1.01 (ref 1.010–1.025)
Urobilinogen, UA: 0.2 E.U./dL
pH, UA: 6 (ref 5.0–8.0)

## 2020-12-06 MED ORDER — CIPROFLOXACIN HCL 250 MG PO TABS: 250.0000 mg | ORAL_TABLET | Freq: Two times a day (BID) | ORAL | 0 refills | Status: AC

## 2020-12-06 NOTE — Patient Instructions (Signed)
Please follow up if symptoms do not improve or as needed.    This could be due to a kidney stone or infection or other.   I've printed a prescription for an antibiotic to take if recurs. I've ordered a culture to know if there is infection.  Drink plenty of water over the weekend.

## 2020-12-06 NOTE — Telephone Encounter (Signed)
Patient has been scheduled

## 2020-12-06 NOTE — Progress Notes (Signed)
Subjective  CC:  Chief Complaint  Patient presents with   Flank Pain    Pt c/o right flank pain off and on x 3 days.Some discomfort with urination.    HPI: Patricia Patrick is a 80 y.o. female who presents to the office today to address the problems listed above in the chief complaint. 80 year old mostly healthy female reports intermittent right flank pain.  She awoke from sleep 3 nights ago due to right flank discomfort.  Described as a hard constant pain that lasted about 10 minutes.  It resolved spontaneously.  Next day she felt okay.  The same thing happened that night.  She awoke with sharp pain.  It lasted longer, she took Aleve and was able to fall back asleep eventually.  Pain probably lasted 1 to 2 hours.  She had no associated symptoms, specifically no fevers, chills, gross hematuria or dysuria.  She had no pain with outpatient and no exacerbation of pain with movement.  Next day, the pain was no longer there but she did feel discomfort in the right flank and radiated down towards her bladder anteriorly.  Denies dysuria.  No gross hematuria.  She does report she had an x-ray that she said she had kidney stones.  No history of renal colic.  Last night she took, before going to bed.  She had no further symptoms.  Today she feels her normal self. Office Visit on 12/06/2020  Component Date Value Ref Range Status   Color, UA 12/06/2020 yellow   Final   Clarity, UA 12/06/2020 little cloudy   Final   Glucose, UA 12/06/2020 Negative  Negative Final   Bilirubin, UA 12/06/2020 neg   Final   Ketones, UA 12/06/2020 neg   Final   Spec Grav, UA 12/06/2020 1.010  1.010 - 1.025 Final   Blood, UA 12/06/2020 postive 3+   Final   pH, UA 12/06/2020 6.0  5.0 - 8.0 Final   Protein, UA 12/06/2020 Negative  Negative Final   Urobilinogen, UA 12/06/2020 0.2  0.2 or 1.0 E.U./dL Final   Nitrite, UA 12/06/2020 neg   Final   Leukocytes, UA 12/06/2020 Moderate (2+) (A) Negative Final    Assessment  1.  Right flank pain      Plan  Right flank pain consistent with renal colic or genitourinary tract infection or both: Education counseling given.  She currently is asymptomatic.  Urine is abnormal.  Cipro 250 mg twice daily for 3 days ordered.  Push fluids.  Monitor for recurrent symptoms.  Pain medicines as needed.  Monitor for gross hematuria.  Await urine culture.  If worsens, to ER.  Otherwise follow-up with me next week if needed.  Evaluated for 1 acute undiagnosed problem, unclear diagnosis and prognosis.  Lab work and prescriptions and counseling given. Follow up: As needed Visit date not found  Orders Placed This Encounter  Procedures   Urine Culture   POCT urinalysis dipstick   Meds ordered this encounter  Medications   ciprofloxacin (CIPRO) 250 MG tablet    Sig: Take 1 tablet (250 mg total) by mouth 2 (two) times daily for 3 days.    Dispense:  6 tablet    Refill:  0      I reviewed the patients updated PMH, FH, and SocHx.    Patient Active Problem List   Diagnosis Date Noted   Hormone replacement therapy (HRT) 06/02/2018   Acquired hypothyroidism 06/02/2018   GAD (generalized anxiety disorder) 06/02/2018   Panic attack  06/02/2018   Osteopenia 06/02/2018   Current Meds  Medication Sig   atorvastatin (LIPITOR) 10 MG tablet Take 1 tablet (10 mg total) by mouth daily.   ciprofloxacin (CIPRO) 250 MG tablet Take 1 tablet (250 mg total) by mouth 2 (two) times daily for 3 days.   levothyroxine (SYNTHROID) 75 MCG tablet TAKE 1 TABLET(75 MCG) BY MOUTH DAILY   sertraline (ZOLOFT) 50 MG tablet Take 1 tablet (50 mg total) by mouth daily.    Allergies: Patient is allergic to lobster  [shellfish allergy]. Family History: Patient family history includes Cancer in her sister; Healthy in her son and son; Heart attack in her father; Obesity in her mother. Social History:  Patient  reports that she has never smoked. She has never used smokeless tobacco. She reports current alcohol  use. She reports that she does not use drugs.  Review of Systems: Constitutional: Negative for fever malaise or anorexia Cardiovascular: negative for chest pain Respiratory: negative for SOB or persistent cough Gastrointestinal: negative for abdominal pain  Objective  Vitals: BP (!) 148/80 (BP Location: Left Arm, Patient Position: Sitting, Cuff Size: Normal)   Pulse 60   Temp 97.6 F (36.4 C) (Temporal)   Ht 5\' 6"  (1.676 m)   Wt 138 lb 6.1 oz (62.8 kg)   SpO2 97%   BMI 22.34 kg/m  General: no acute distress , A&Ox3 HEENT: PEERL, conjunctiva normal, neck is supple Cardiovascular:  RRR without murmur or gallop.  Respiratory:  Good breath sounds bilaterally, CTAB with normal respiratory effort Gastrointestinal: soft, flat abdomen, normal active bowel sounds, no palpable masses, no hepatosplenomegaly, no appreciated hernias No ttp. No cvat Back: from. No ttp.  Skin:  Warm, no rashes    Commons side effects, risks, benefits, and alternatives for medications and treatment plan prescribed today were discussed, and the patient expressed understanding of the given instructions. Patient is instructed to call or message via MyChart if he/she has any questions or concerns regarding our treatment plan. No barriers to understanding were identified. We discussed Red Flag symptoms and signs in detail. Patient expressed understanding regarding what to do in case of urgent or emergency type symptoms.  Medication list was reconciled, printed and provided to the patient in AVS. Patient instructions and summary information was reviewed with the patient as documented in the AVS. This note was prepared with assistance of Dragon voice recognition software. Occasional wrong-word or sound-a-like substitutions may have occurred due to the inherent limitations of voice recognition software  This visit occurred during the SARS-CoV-2 public health emergency.  Safety protocols were in place, including screening  questions prior to the visit, additional usage of staff PPE, and extensive cleaning of exam room while observing appropriate contact time as indicated for disinfecting solutions.

## 2020-12-06 NOTE — Telephone Encounter (Signed)
Caller states she has been experiencing low back pain and other kidney stones symptoms. Translation No Nurse Assessment Nurse: Martyn Ehrich, RN, Felicia Date/Time (Eastern Time): 12/05/2020 3:03:41 PM Confirm and document reason for call. If symptomatic, describe symptoms. ---PT has lower back pain onset 2 nights ago briefly in bed and last night 1 h after going to bed it started lower R and went around to the front lower. She has been screening urine and doesnt see kidney stones today and didnt see them. She had Xray that said she had kidney stones 6 wks ago but have not passed after she was seen for hiatal hernia dx. They told her they were small stones. No fever Does the patient have any new or worsening symptoms? ---Yes Will a triage be completed? ---Yes Related visit to physician within the last 2 weeks? ---Yes Does the PT have any chronic conditions? (i.e. diabetes, asthma, this includes High risk factors for pregnancy, etc.) ---No Is this a behavioral health or substance abuse call? ---No Guidelines Guideline Title Affirmed Question Affirmed Notes Nurse Date/Time (Eastern Time) Flank Pain MODERATE pain (e.g., interferes with normal activities or awakens from sleep) Martyn Ehrich, RN, Solmon Ice 06/12/4172 0:81:44 PM PLEASE NOTE: All timestamps contained within this report are represented as [place] Standard Time. CONFIDENTIALTY NOTICE: This fax transmission is intended only for the addressee. It contains information that is legally privileged, confidential or otherwise protected from use or disclosure. If you are not the intended recipient, you are strictly prohibited from reviewing, disclosing, copying using or disseminating any of this information or taking any action in reliance on or regarding this information. If you have received this fax in error, please notify us immediately by telephone so that we can arrange for its return to [place]. Phone: [phone], Toll-Free: [phone], Fax:  [phone] Page: 2 of 2 Call Id: [id number] Elmo. Time Eilene Ghazi Time) Disposition Final User 12/05/2020 1:02:08 PM Attempt made - message left Markus Daft, RN, Sherre Poot 12/05/2020 1:37:18 PM FINAL ATTEMPT MADE - message left Jackqulyn Livings 12/05/2020 1:37:23 PM Send to RN Final Attempt Jethro Poling, RN, Wind

## 2020-12-07 LAB — URINE CULTURE
MICRO NUMBER:: 12124709
SPECIMEN QUALITY:: ADEQUATE

## 2020-12-30 ENCOUNTER — Other Ambulatory Visit: Payer: Self-pay | Admitting: Family Medicine

## 2020-12-30 ENCOUNTER — Other Ambulatory Visit: Payer: Self-pay

## 2020-12-30 MED ORDER — OMEPRAZOLE 20 MG PO CPDR: 20.0000 mg | DELAYED_RELEASE_CAPSULE | Freq: Every day | ORAL | 1 refills | Status: DC

## 2021-02-12 ENCOUNTER — Other Ambulatory Visit: Payer: Self-pay

## 2021-02-12 ENCOUNTER — Ambulatory Visit (INDEPENDENT_AMBULATORY_CARE_PROVIDER_SITE_OTHER): Payer: Medicare Other

## 2021-02-12 DIAGNOSIS — Z23 Encounter for immunization: Secondary | ICD-10-CM

## 2021-04-28 DIAGNOSIS — H2513 Age-related nuclear cataract, bilateral: Secondary | ICD-10-CM | POA: Diagnosis not present

## 2021-05-01 ENCOUNTER — Other Ambulatory Visit: Payer: Self-pay | Admitting: Family Medicine

## 2021-06-13 ENCOUNTER — Ambulatory Visit (INDEPENDENT_AMBULATORY_CARE_PROVIDER_SITE_OTHER): Payer: Medicare Other

## 2021-06-13 ENCOUNTER — Other Ambulatory Visit: Payer: Self-pay

## 2021-06-13 VITALS — BP 122/72 | HR 58 | Temp 97.6°F | Wt 141.0 lb

## 2021-06-13 DIAGNOSIS — Z0000 Encounter for general adult medical examination without abnormal findings: Secondary | ICD-10-CM

## 2021-06-13 NOTE — Progress Notes (Addendum)
Subjective:   Patricia Patrick is a 81 y.o. female who presents for Medicare Annual (Subsequent) preventive examination.  Review of Systems     Cardiac Risk Factors include: advanced age (>60men, >19 women)     Objective:    Today's Vitals   06/13/21 1115  BP: 122/72  Pulse: (!) 58  Temp: 97.6 F (36.4 C)  SpO2: 94%  Weight: 141 lb (64 kg)   Body mass index is 22.76 kg/m.  Advanced Directives 06/13/2021 06/07/2020 02/14/2019 12/31/2015  Does Patient Have a Medical Advance Directive? Yes Yes Yes No  Type of Paramedic of [place];Living will Living will -  Does patient want to make changes to medical advance directive? - - No - Patient declined -  Copy of [place] in Chart? No - copy requested No - copy requested - -    Current Medications (verified) Outpatient Encounter Medications as of 06/13/2021  Medication Sig   atorvastatin (LIPITOR) 10 MG tablet Take 1 tablet (10 mg total) by mouth daily.   levothyroxine (SYNTHROID) 75 MCG tablet TAKE 1 TABLET(75 MCG) BY MOUTH DAILY   omeprazole (PRILOSEC) 20 MG capsule TAKE 1 CAPSULE(20 MG) BY MOUTH DAILY FOR 30 DOSES   sertraline (ZOLOFT) 50 MG tablet Take 1 tablet (50 mg total) by mouth daily.   [DISCONTINUED] famotidine (PEPCID) 20 MG tablet Take 1 tablet (20 mg total) by mouth 2 (two) times daily. Takes 30 minutes before breakfast and diner   No facility-administered encounter medications on file as of 06/13/2021.    Allergies (verified) Lobster  [shellfish allergy]   History: Past Medical History:  Diagnosis Date   GAD (generalized anxiety disorder) 06/02/2018   Osteopenia 06/02/2018   DEXA 05/2016 lowest T = -1.8 10 FRAC score 1.8%, major frac score 9.9%; rec recheck 2 years and calcium and vit D.   Thyroid disease    Past Surgical History:  Procedure Laterality Date   ABDOMINAL HYSTERECTOMY  2009   CHOLECYSTECTOMY  2009   Family History  Problem  Relation Age of Onset   Obesity Mother    Heart attack Father    Cancer Sister    Healthy Son    Healthy Son    Social History   Socioeconomic History   Marital status: Married    Spouse name: Not on file   Number of children: Not on file   Years of education: Not on file   Highest education level: Not on file  Occupational History   Occupation: retired  Tobacco Use   Smoking status: Never   Smokeless tobacco: Never  Substance and Sexual Activity   Alcohol use: Yes   Drug use: No   Sexual activity: Not on file  Other Topics Concern   Not on file  Social History Narrative   Previously lived in [place]    Has twin sisters who live in the area    Social Determinants of Health   Financial Resource Strain: Low Risk    Difficulty of Paying Living Expenses: Not hard at all  Food Insecurity: No Food Insecurity   Worried About Charity fundraiser in the Last Year: Never true   Arboriculturist in the Last Year: Never true  Transportation Needs: No Transportation Needs   Lack of Transportation (Medical): No   Lack of Transportation (Non-Medical): No  Physical Activity: Insufficiently Active   Days of Exercise per Week: 4 days   Minutes of Exercise  per Session: 20 min  Stress: No Stress Concern Present   Feeling of Stress : Not at all  Social Connections: Socially Integrated   Frequency of Communication with Friends and Family: More than three times a week   Frequency of Social Gatherings with Friends and Family: More than three times a week   Attends Religious Services: More than 4 times per year   Active Member of [employer] or Organizations: Yes   Attends Archivist Meetings: 1 to 4 times per year   Marital Status: Married    Tobacco Counseling Counseling given: Not Answered   Clinical Intake:  Pre-visit preparation completed: Yes  Pain : No/denies pain     BMI - recorded: 22.76 Nutritional Status: BMI of 19-24  Normal Nutritional Risks: None Diabetes:  No  How often do you need to have someone help you when you read instructions, pamphlets, or other written materials from your doctor or pharmacy?: 1 - Never  Diabetic?no  Interpreter Needed?: No  Information entered by :: Charlott Rakes, LPN   Activities of Daily Living In your present state of health, do you have any difficulty performing the following activities: 06/13/2021 06/13/2020  Hearing? N N  Vision? N N  Difficulty concentrating or making decisions? N N  Walking or climbing stairs? N N  Dressing or bathing? N N  Doing errands, shopping? N N  Preparing Food and eating ? N -  Using the Toilet? N -  In the past six months, have you accidently leaked urine? N -  Do you have problems with loss of bowel control? N -  Managing your Medications? N -  Managing your Finances? N -  Housekeeping or managing your Housekeeping? N -  Some recent data might be hidden    Patient Care Team: Leamon Arnt, MD as PCP - General (Family Medicine)  Indicate any recent Medical Services you may have received from other than [facility] providers in the past year (date may be approximate).     Assessment:   This is a routine wellness examination for Patricia Patrick.  Hearing/Vision screen Hearing Screening - Comments:: Pt denies any hearing issues  Vision Screening - Comments:: Pt follows up with [facility]   Dietary issues and exercise activities discussed: Current Exercise Habits: Home exercise routine, Type of exercise: walking, Time (Minutes): 20, Frequency (Times/Week): 5, Weekly Exercise (Minutes/Week): 100   Goals Addressed             This Visit's Progress    Patient Stated       Exercise more        Depression Screen PHQ 2/9 Scores 06/13/2021 06/13/2020 06/07/2020 02/14/2019 08/02/2018 06/02/2018  PHQ - 2 Score 0 0 0 0 0 0  PHQ- 9 Score - - - - - 0    Fall Risk Fall Risk  06/13/2021 06/13/2020 06/07/2020 06/13/2019 02/14/2019  Falls in the past year? 0 0 0 0 0  Number falls in past  yr: 0 0 0 - 0  Injury with Fall? 0 0 0 - 0  Risk for fall due to : Impaired vision - Impaired vision - -  Follow up Falls prevention discussed - Falls prevention discussed - Falls evaluation completed;Education provided;Falls prevention discussed    FALL RISK PREVENTION PERTAINING TO THE HOME:  Any stairs in or around the home? Yes  If so, are there any without handrails? No  Home free of loose throw rugs in walkways, pet beds, electrical cords, etc? Yes  Adequate  lighting in your home to reduce risk of falls? Yes   ASSISTIVE DEVICES UTILIZED TO PREVENT FALLS:  Life alert? No  Use of a cane, walker or w/c? No  Grab bars in the bathroom? No  Shower chair or bench in shower? Yes  Elevated toilet seat or a handicapped toilet? No   TIMED UP AND GO:  Was the test performed? Yes .  Length of time to ambulate 10 feet: 10 sec.   Gait steady and fast without use of assistive device  Cognitive Function: MMSE - Mini Mental State Exam 02/14/2019  Orientation to time 5  Orientation to Place 5  Registration 3  Attention/ Calculation 5  Recall 3  Language- name 2 objects 2  Language- repeat 1  Language- follow 3 step command 3  Language- read & follow direction 1  Write a sentence 1  Copy design 1  Total score 30     6CIT Screen 06/13/2021 06/07/2020  What Year? 0 points 0 points  What month? 0 points 0 points  What time? 0 points -  Count back from 20 0 points 0 points  Months in reverse 0 points 0 points  Repeat phrase 2 points 0 points  Total Score 2 -    Immunizations Immunization History  Administered Date(s) Administered   Fluad Quad(high Dose 65+) 02/14/2019, 02/11/2020, 02/12/2021   Influenza, High Dose Seasonal PF 02/21/2015, 03/23/2016   Influenza-Unspecified 02/21/2017   PFIZER(Purple Top)SARS-COV-2 Vaccination 06/16/2019, 07/07/2019, 02/11/2020   Pneumococcal Conjugate-13 10/22/2015   Pneumococcal Polysaccharide-23 03/24/2017   Tdap 10/22/2015   Zoster  Recombinat (Shingrix) 06/02/2018, 08/01/2018    TDAP status: Up to date  Flu Vaccine status: Up to date  Pneumococcal vaccine status: Up to date  Covid-19 vaccine status: Completed vaccines  Qualifies for Shingles Vaccine? Yes   Zostavax completed Yes   Shingrix Completed?: Yes  Screening Tests Health Maintenance  Topic Date Due   COVID-19 Vaccine (4 - Booster for Pfizer series) 04/07/2020   MAMMOGRAM  10/31/2021 (Originally 09/12/2020)   DEXA SCAN  09/12/2021   TETANUS/TDAP  10/21/2025   Pneumonia Vaccine 42+ Years old  Completed   INFLUENZA VACCINE  Completed   Hepatitis C Screening  Completed   Zoster Vaccines- Shingrix  Completed   HPV VACCINES  Aged Out    Health Maintenance  Health Maintenance Due  Topic Date Due   COVID-19 Vaccine (4 - Booster for [place] series) 04/07/2020    Colorectal cancer screening: No longer required.   Mammogram status: Completed 09/13/19 postponed until 10/31/21. Repeat every year  Bone Density status: Completed 09/13/19. Results reflect: Bone density results: OSTEOPENIA. Repeat every 2 years.   Additional Screening:  Hepatitis C Screening:  Completed 06/13/20  Vision Screening: Recommended annual ophthalmology exams for early detection of glaucoma and other disorders of the eye. Is the patient up to date with their annual eye exam?  Yes  Who is the provider or what is the name of the office in which the patient attends annual eye exams? [facility] If pt is not established with a provider, would they like to be referred to a provider to establish care? No .   Dental Screening: Recommended annual dental exams for proper oral hygiene  Community Resource Referral / Chronic Care Management: CRR required this visit?  No   CCM required this visit?  No      Plan:     I have personally reviewed and noted the following in the patients chart:   Medical  and social history Use of alcohol, tobacco or illicit drugs  Current  medications and supplements including opioid prescriptions.  Functional ability and status Nutritional status Physical activity Advanced directives List of other physicians Hospitalizations, surgeries, and ER visits in previous 12 months Vitals Screenings to include cognitive, depression, and falls Referrals and appointments  In addition, I have reviewed and discussed with patient certain preventive protocols, quality metrics, and best practice recommendations. A written personalized care plan for preventive services as well as general preventive health recommendations were provided to patient.     Willette Brace, LPN   [id number]   Nurse Notes: None

## 2021-06-13 NOTE — Patient Instructions (Signed)
[name] , Thank you for taking time to come for your Medicare Wellness Visit. I appreciate your ongoing commitment to your health goals. Please review the following plan we discussed and let me know if I can assist you in the future.   Screening recommendations/referrals: Colonoscopy:no longer required  Mammogram: Postponed 10/31/21 Bone Density: Done 09/13/19 repeat every 2 years  Recommended yearly ophthalmology/optometry visit for glaucoma screening and checkup Recommended yearly dental visit for hygiene and checkup  Vaccinations: Influenza vaccine: Done 02/12/21 repeat every year  Pneumococcal vaccine: Up to date Tdap vaccine: Done 10/22/15 repeat every 10 years  Shingles vaccine: Completed 1/9, 08/01/18    Covid-19:Completed 1/22, 2/12, & 02/11/20  Advanced directives: Please bring a copy of your health care power of attorney and living will to the office at your convenience.  Conditions/risks identified: exercise more   Next appointment: Follow up in one year for your annual wellness visit    Preventive Care 65 Years and Older, Female Preventive care refers to lifestyle choices and visits with your health care provider that can promote health and wellness. What does preventive care include? A yearly physical exam. This is also called an annual well check. Dental exams once or twice a year. Routine eye exams. Ask your health care provider how often you should have your eyes checked. Personal lifestyle choices, including: Daily care of your teeth and gums. Regular physical activity. Eating a healthy diet. Avoiding tobacco and drug use. Limiting alcohol use. Practicing safe sex. Taking low-dose aspirin every day. Taking vitamin and mineral supplements as recommended by your health care provider. What happens during an annual well check? The services and screenings done by your health care provider during your annual well check will depend on your age, overall health,  lifestyle risk factors, and family history of disease. Counseling  Your health care provider may ask you questions about your: Alcohol use. Tobacco use. Drug use. Emotional well-being. Home and relationship well-being. Sexual activity. Eating habits. History of falls. Memory and ability to understand (cognition). Work and work Statistician. Reproductive health. Screening  You may have the following tests or measurements: Height, weight, and BMI. Blood pressure. Lipid and cholesterol levels. These may be checked every 5 years, or more frequently if you are over 5 years old. Skin check. Lung cancer screening. You may have this screening every year starting at age 4 if you have a 30-pack-year history of smoking and currently smoke or have quit within the past 15 years. Fecal occult blood test (FOBT) of the stool. You may have this test every year starting at age 70. Flexible sigmoidoscopy or colonoscopy. You may have a sigmoidoscopy every 5 years or a colonoscopy every 10 years starting at age 75. Hepatitis C blood test. Hepatitis B blood test. Sexually transmitted disease (STD) testing. Diabetes screening. This is done by checking your blood sugar (glucose) after you have not eaten for a while (fasting). You may have this done every 1-3 years. Bone density scan. This is done to screen for osteoporosis. You may have this done starting at age 48. Mammogram. This may be done every 1-2 years. Talk to your health care provider about how often you should have regular mammograms. Talk with your health care provider about your test results, treatment options, and if necessary, the need for more tests. Vaccines  Your health care provider may recommend certain vaccines, such as: Influenza vaccine. This is recommended every year. Tetanus, diphtheria, and acellular pertussis (Tdap, Td) vaccine. You may need  a Td booster every 10 years. Zoster vaccine. You may need this after age  65. Pneumococcal 13-valent conjugate (PCV13) vaccine. One dose is recommended after age 30. Pneumococcal polysaccharide (PPSV23) vaccine. One dose is recommended after age 50. Talk to your health care provider about which screenings and vaccines you need and how often you need them. This information is not intended to replace advice given to you by your health care provider. Make sure you discuss any questions you have with your health care provider. Document Released: 06/07/2015 Document Revised: 01/29/2016 Document Reviewed: 03/12/2015 Elsevier Interactive Patient Education  2017 [employer] Prevention in the Home Falls can cause injuries. They can happen to people of all ages. There are many things you can do to make your home safe and to help prevent falls. What can I do on the outside of my home? Regularly fix the edges of walkways and driveways and fix any cracks. Remove anything that might make you trip as you walk through a door, such as a raised step or threshold. Trim any bushes or trees on the path to your home. Use bright outdoor lighting. Clear any walking paths of anything that might make someone trip, such as rocks or tools. Regularly check to see if handrails are loose or broken. Make sure that both sides of any steps have handrails. Any raised decks and porches should have guardrails on the edges. Have any leaves, snow, or ice cleared regularly. Use sand or salt on walking paths during winter. Clean up any spills in your garage right away. This includes oil or grease spills. What can I do in the bathroom? Use night lights. Install grab bars by the toilet and in the tub and shower. Do not use towel bars as grab bars. Use non-skid mats or decals in the tub or shower. If you need to sit down in the shower, use a plastic, non-slip stool. Keep the floor dry. Clean up any water that spills on the floor as soon as it happens. Remove soap buildup in the tub or shower  regularly. Attach bath mats securely with double-sided non-slip rug tape. Do not have throw rugs and other things on the floor that can make you trip. What can I do in the bedroom? Use night lights. Make sure that you have a light by your bed that is easy to reach. Do not use any sheets or blankets that are too big for your bed. They should not hang down onto the floor. Have a firm chair that has side arms. You can use this for support while you get dressed. Do not have throw rugs and other things on the floor that can make you trip. What can I do in the kitchen? Clean up any spills right away. Avoid walking on wet floors. Keep items that you use a lot in easy-to-reach places. If you need to reach something above you, use a strong step stool that has a grab bar. Keep electrical cords out of the way. Do not use floor polish or wax that makes floors slippery. If you must use wax, use non-skid floor wax. Do not have throw rugs and other things on the floor that can make you trip. What can I do with my stairs? Do not leave any items on the stairs. Make sure that there are handrails on both sides of the stairs and use them. Fix handrails that are broken or loose. Make sure that handrails are as long as the stairways. Check  any carpeting to make sure that it is firmly attached to the stairs. Fix any carpet that is loose or worn. Avoid having throw rugs at the top or bottom of the stairs. If you do have throw rugs, attach them to the floor with carpet tape. Make sure that you have a light switch at the top of the stairs and the bottom of the stairs. If you do not have them, ask someone to add them for you. What else can I do to help prevent falls? Wear shoes that: Do not have high heels. Have rubber bottoms. Are comfortable and fit you well. Are closed at the toe. Do not wear sandals. If you use a stepladder: Make sure that it is fully opened. Do not climb a closed stepladder. Make sure that  both sides of the stepladder are locked into place. Ask someone to hold it for you, if possible. Clearly mark and make sure that you can see: Any grab bars or handrails. First and last steps. Where the edge of each step is. Use tools that help you move around (mobility aids) if they are needed. These include: Canes. Walkers. Scooters. Crutches. Turn on the lights when you go into a dark area. Replace any light bulbs as soon as they burn out. Set up your furniture so you have a clear path. Avoid moving your furniture around. If any of your floors are uneven, fix them. If there are any pets around you, be aware of where they are. Review your medicines with your doctor. Some medicines can make you feel dizzy. This can increase your chance of falling. Ask your doctor what other things that you can do to help prevent falls. This information is not intended to replace advice given to you by your health care provider. Make sure you discuss any questions you have with your health care provider. Document Released: 03/07/2009 Document Revised: 10/17/2015 Document Reviewed: 06/15/2014 Elsevier Interactive Patient Education  2017 [employer].

## 2021-06-19 ENCOUNTER — Ambulatory Visit (INDEPENDENT_AMBULATORY_CARE_PROVIDER_SITE_OTHER): Payer: Medicare Other | Admitting: Family Medicine

## 2021-06-19 ENCOUNTER — Other Ambulatory Visit: Payer: Self-pay

## 2021-06-19 ENCOUNTER — Encounter: Payer: Self-pay | Admitting: Family Medicine

## 2021-06-19 VITALS — BP 144/79 | HR 65 | Temp 97.5°F | Ht 66.0 in | Wt 142.2 lb

## 2021-06-19 DIAGNOSIS — Z0000 Encounter for general adult medical examination without abnormal findings: Secondary | ICD-10-CM

## 2021-06-19 DIAGNOSIS — E039 Hypothyroidism, unspecified: Secondary | ICD-10-CM

## 2021-06-19 DIAGNOSIS — F411 Generalized anxiety disorder: Secondary | ICD-10-CM

## 2021-06-19 DIAGNOSIS — M8580 Other specified disorders of bone density and structure, unspecified site: Secondary | ICD-10-CM | POA: Diagnosis not present

## 2021-06-19 DIAGNOSIS — Z1231 Encounter for screening mammogram for malignant neoplasm of breast: Secondary | ICD-10-CM | POA: Diagnosis not present

## 2021-06-19 DIAGNOSIS — K219 Gastro-esophageal reflux disease without esophagitis: Secondary | ICD-10-CM

## 2021-06-19 DIAGNOSIS — Z9229 Personal history of other drug therapy: Secondary | ICD-10-CM | POA: Diagnosis not present

## 2021-06-19 DIAGNOSIS — Z780 Asymptomatic menopausal state: Secondary | ICD-10-CM | POA: Diagnosis not present

## 2021-06-19 DIAGNOSIS — E782 Mixed hyperlipidemia: Secondary | ICD-10-CM

## 2021-06-19 LAB — COMPREHENSIVE METABOLIC PANEL
ALT: 9 U/L (ref 0–35)
AST: 16 U/L (ref 0–37)
Albumin: 4.1 g/dL (ref 3.5–5.2)
Alkaline Phosphatase: 126 U/L — ABNORMAL HIGH (ref 39–117)
BUN: 16 mg/dL (ref 6–23)
CO2: 30 mEq/L (ref 19–32)
Calcium: 9.8 mg/dL (ref 8.4–10.5)
Chloride: 105 mEq/L (ref 96–112)
Creatinine, Ser: 0.92 mg/dL (ref 0.40–1.20)
GFR: 59.19 mL/min — ABNORMAL LOW (ref >60.00)
Glucose, Bld: 97 mg/dL (ref 70–99)
Potassium: 5.1 mEq/L (ref 3.5–5.1)
Sodium: 143 mEq/L (ref 135–145)
Total Bilirubin: 0.5 mg/dL (ref 0.2–1.2)
Total Protein: 6.6 g/dL (ref 6.0–8.3)

## 2021-06-19 LAB — LIPID PANEL
Cholesterol: 174 mg/dL (ref 0–200)
HDL: 78.8 mg/dL (ref >39.00)
LDL Cholesterol: 75 mg/dL (ref 0–99)
NonHDL: 95.52
Total CHOL/HDL Ratio: 2
Triglycerides: 105 mg/dL (ref 0.0–149.0)
VLDL: 21 mg/dL (ref 0.0–40.0)

## 2021-06-19 LAB — CBC WITH DIFFERENTIAL/PLATELET
Basophils Absolute: 0.1 10*3/uL (ref 0.0–0.1)
Basophils Relative: 1 % (ref 0.0–3.0)
Eosinophils Absolute: 0.1 10*3/uL (ref 0.0–0.7)
Eosinophils Relative: 2.2 % (ref 0.0–5.0)
HCT: 41.7 % (ref 36.0–46.0)
Hemoglobin: 13.6 g/dL (ref 12.0–15.0)
Lymphocytes Relative: 24.8 % (ref 12.0–46.0)
Lymphs Abs: 1.4 10*3/uL (ref 0.7–4.0)
MCHC: 32.5 g/dL (ref 30.0–36.0)
MCV: 86.9 fl (ref 78.0–100.0)
Monocytes Absolute: 0.2 10*3/uL (ref 0.1–1.0)
Monocytes Relative: 4.1 % (ref 3.0–12.0)
Neutro Abs: 3.8 10*3/uL (ref 1.4–7.7)
Neutrophils Relative %: 67.9 % (ref 43.0–77.0)
Platelets: 210 10*3/uL (ref 150.0–400.0)
RBC: 4.8 Mil/uL (ref 3.87–5.11)
RDW: 13.5 % (ref 11.5–15.5)
WBC: 5.6 10*3/uL (ref 4.0–10.5)

## 2021-06-19 LAB — TSH: TSH: 0.69 u[IU]/mL (ref 0.35–5.50)

## 2021-06-19 MED ORDER — SERTRALINE HCL 50 MG PO TABS: 50.0000 mg | ORAL_TABLET | Freq: Every day | ORAL | 3 refills | Status: DC

## 2021-06-19 NOTE — Patient Instructions (Signed)
Please return in 12 months for your annual complete physical; please come fasting.   I will release your lab results to you on your MyChart account with further instructions. Please reply with any questions.    Have a wonderful year! I will refill your thyroid and cholesterol medications once I receive the blood test results.   If you have any questions or concerns, please don't hesitate to send me a message via MyChart or call the office at [phone]. Thank you for visiting with Patricia Patrick today! It's our pleasure caring for you.   I have ordered a mammogram and/or bone density for you as we discussed today: [x]   Mammogram  [x]   Bone Density  Please call the office checked below to schedule your appointment:  [x]   [facility]      [place], [place]         []   [facility]  [address] [place], [name]

## 2021-06-19 NOTE — Progress Notes (Signed)
Subjective  Chief Complaint  Patient presents with   Annual Exam    Non fasting    HPI: Patricia Patrick is a 81 y.o. female who presents to [facility] [place] today for a Female Wellness Visit. She also has the concerns and/or needs as listed above in the chief complaint. These will be addressed in addition to the Health Maintenance Visit.   Wellness Visit: annual visit with health maintenance review and exam without Pap  Health maintenance: Healthy 81 year old active independent female.  Reviewed annual wellness visit.  Eligible for mammogram, she is hesitant.  Declines colorectal cancer screening.  She is almost 81 years old, this is reasonable.  Immunizations are all up-to-date.  She is feeling well without concerns Chronic disease f/u and/or acute problem visit: (deemed necessary to be done in addition to the wellness visit): Osteopenia with elevated FRAX: Due for bone density this year.  Takes calcium and vitamin D and is very active.  No fractures History of HRT up until last year: She has successfully weaned off it completely. Hypothyroidism: Continues to take her medication daily.  No symptoms of high or low thyroid.  Has been well controlled. mixed hyperlipidemia: Elevated atherosclerotic risk score, started Lipitor 10 mg nightly last year.  Tolerating well.  Nonfasting for recheck today. Anxiety, chronic: Remains well controlled on Zoloft Had some GERD symptoms earlier last year, likely stress-induced from her husband's acute illness.  Stress is calm down.  She has no breakthrough symptoms on omeprazole 20 daily.  Assessment  1. Annual physical exam   2. Acquired hypothyroidism   3. GAD (generalized anxiety disorder)   4. Osteopenia, unspecified location   5. Mixed hyperlipidemia   6. Encounter for screening mammogram for breast cancer   7. Asymptomatic menopausal state   8. Gastroesophageal reflux disease without esophagitis   9. History of  postmenopausal HRT      Plan  Female Wellness Visit: Age appropriate Health Maintenance and Prevention measures were discussed with patient. Included topics are cancer screening recommendations, ways to keep healthy (see AVS) including dietary and exercise recommendations, regular eye and dental care, use of seat belts, and avoidance of moderate alcohol use and tobacco use.  Mammogram and bone density ordered.  Education given.  No longer needs colorectal cancer screening BMI: discussed patient's BMI and encouraged positive lifestyle modifications to help get to or maintain a target BMI. HM needs and immunizations were addressed and ordered. See below for orders. See HM and immunization section for updates.  Up-to-date Routine labs and screening tests ordered including cmp, cbc and lipids where appropriate. Discussed recommendations regarding Vit D and calcium supplementation (see AVS)  Chronic disease management visit and/or acute problem visit: Recheck thyroid levels on levothyroxine 75 mcg daily.  Clinically euthyroid she will need refills. Hyperlipidemia: Recheck lipid panel.  Want LDL less than 100 ideally.  We will adjust medication dose if needed.  Check LFTs General anxiety disorder well-controlled on Zoloft 50 mg daily.  Continue. Discussed osteopenia weightbearing exercises and calcium supplements.  We will recheck bone density. GERD: Now well controlled on PPI.  Recommend weaning. History of HRT: Now off HRT.  Follow up: Return in about 1 year (around 06/19/2022) for complete physical.  Orders Placed This Encounter  Procedures   MM DIGITAL SCREENING BILATERAL   DG Bone Density   CBC with Differential/Platelet   Comprehensive metabolic panel   Lipid panel   TSH   Meds ordered this encounter  Medications   sertraline (ZOLOFT) 50 MG tablet    Sig: Take 1 tablet (50 mg total) by mouth daily.    Dispense:  90 tablet    Refill:  3      Body mass index is 22.95 kg/m. Wt  Readings from Last 3 Encounters:  06/19/21 142 lb 3.2 oz (64.5 kg)  06/13/21 141 lb (64 kg)  12/06/20 138 lb 6.1 oz (62.8 kg)     Patient Active Problem List   Diagnosis Date Noted   GERD (gastroesophageal reflux disease) 06/19/2021    Priority: Medium    Mixed hyperlipidemia 06/19/2021   History of postmenopausal HRT 06/19/2021   Acquired hypothyroidism 06/02/2018   GAD (generalized anxiety disorder) 06/02/2018    Reports history of panic attacks and anxiety: Unclear reasons.  Started on Zoloft and is started well.  Started around 2012.  Has never tried coming off of the medication.  Currently well controlled.    Panic attack 06/02/2018   Osteopenia 06/02/2018    DEXA 05/2016 lowest T = -1.8 10 FRAC score 1.8%, major frac score 9.9%; rec recheck 2 years and calcium and vit D. DEXA 08/2019 osteopenia lowest T=-1.4, FRAX score major 15%; stable, recheck 2 years.     Health Maintenance  Topic Date Due   COVID-19 Vaccine (4 - Booster for Pfizer series) 04/07/2020   MAMMOGRAM  09/12/2020   DEXA SCAN  09/12/2021   TETANUS/TDAP  10/21/2025   Pneumonia Vaccine 29+ Years old  Completed   INFLUENZA VACCINE  Completed   Hepatitis C Screening  Completed   Zoster Vaccines- Shingrix  Completed   HPV VACCINES  Aged Out   Immunization History  Administered Date(s) Administered   Fluad Quad(high Dose 65+) 02/14/2019, 02/11/2020, 02/12/2021   Influenza, High Dose Seasonal PF 02/21/2015, 03/23/2016   Influenza-Unspecified 02/21/2017   PFIZER(Purple Top)SARS-COV-2 Vaccination 06/16/2019, 07/07/2019, 02/11/2020   Pneumococcal Conjugate-13 10/22/2015   Pneumococcal Polysaccharide-23 03/24/2017   Tdap 10/22/2015   Zoster Recombinat (Shingrix) 06/02/2018, 08/01/2018   We updated and reviewed the patient's past history in detail and it is documented below. Allergies: Patient is allergic to lobster  [shellfish allergy]. Past Medical History Patient  has a past medical history of GAD  (generalized anxiety disorder) (06/02/2018), Osteopenia (06/02/2018), and Thyroid disease. Past Surgical History Patient  has a past surgical history that includes Cholecystectomy (2009) and Abdominal hysterectomy (2009). Family History: Patient family history includes Cancer in her sister; Healthy in her son and son; Heart attack in her father; Obesity in her mother. Social History:  Patient  reports that she has never smoked. She has never used smokeless tobacco. She reports current alcohol use. She reports that she does not use drugs.  Review of Systems: Constitutional: negative for fever or malaise Ophthalmic: negative for photophobia, double vision or loss of vision Cardiovascular: negative for chest pain, dyspnea on exertion, or new LE swelling Respiratory: negative for SOB or persistent cough Gastrointestinal: negative for abdominal pain, change in bowel habits or melena Genitourinary: negative for dysuria or gross hematuria, no abnormal uterine bleeding or disharge Musculoskeletal: negative for new gait disturbance or muscular weakness Integumentary: negative for new or persistent rashes, no breast lumps Neurological: negative for TIA or stroke symptoms Psychiatric: negative for SI or delusions Allergic/Immunologic: negative for hives  Patient Care Team    Relationship Specialty Notifications Start End  Leamon Arnt, MD PCP - General Family Medicine  06/02/18     Objective  Vitals: BP (!) 144/79    Pulse  65    Temp (!) 97.5 F (36.4 C) (Temporal)    Ht 5\' 6"  (1.676 m)    Wt 142 lb 3.2 oz (64.5 kg)    SpO2 97%    BMI 22.95 kg/m  General:  Well developed, well nourished, no acute distress  Psych:  Alert and orientedx3,normal mood and affect HEENT:  Normocephalic, atraumatic, non-icteric sclera,  supple neck without adenopathy, mass or thyromegaly Cardiovascular:  Normal S1, S2, RRR without gallop, rub or murmur Respiratory:  Good breath sounds bilaterally, CTAB with normal  respiratory effort Gastrointestinal: normal bowel sounds, soft, non-tender, no noted masses. No HSM MSK: no deformities, contusions. Joints are without erythema or swelling.  Skin:  Warm, no rashes or suspicious lesions noted Neurologic:    Mental status is normal. CN 2-11 are normal. Gross motor and sensory exams are normal. Normal gait. No tremor Breast Exam: No mass, skin retraction or nipple discharge is appreciated in either breast. No axillary adenopathy. Fibrocystic changes are not noted   Commons side effects, risks, benefits, and alternatives for medications and treatment plan prescribed today were discussed, and the patient expressed understanding of the given instructions. Patient is instructed to call or message via MyChart if he/she has any questions or concerns regarding our treatment plan. No barriers to understanding were identified. We discussed Red Flag symptoms and signs in detail. Patient expressed understanding regarding what to do in case of urgent or emergency type symptoms.  Medication list was reconciled, printed and provided to the patient in AVS. Patient instructions and summary information was reviewed with the patient as documented in the AVS. This note was prepared with assistance of Dragon voice recognition software. Occasional wrong-word or sound-a-like substitutions may have occurred due to the inherent limitations of voice recognition software  This visit occurred during the SARS-CoV-2 public health emergency.  Safety protocols were in place, including screening questions prior to the visit, additional usage of staff PPE, and extensive cleaning of exam room while observing appropriate contact time as indicated for disinfecting solutions.

## 2021-06-20 MED ORDER — ATORVASTATIN CALCIUM 10 MG PO TABS: 10.0000 mg | ORAL_TABLET | Freq: Every evening | ORAL | 3 refills | Status: DC

## 2021-06-20 MED ORDER — LEVOTHYROXINE SODIUM 75 MCG PO TABS
75.0000 ug | ORAL_TABLET | Freq: Every day | ORAL | 3 refills | Status: DC
Start: 2021-06-20 — End: 2022-06-22

## 2021-06-20 NOTE — Addendum Note (Signed)
Addended by: Billey Chang on: 06/20/2021 08:23 AM   Modules accepted: Orders

## 2021-07-01 DIAGNOSIS — H25043 Posterior subcapsular polar age-related cataract, bilateral: Secondary | ICD-10-CM | POA: Diagnosis not present

## 2021-07-01 DIAGNOSIS — H2512 Age-related nuclear cataract, left eye: Secondary | ICD-10-CM | POA: Diagnosis not present

## 2021-07-01 DIAGNOSIS — H2513 Age-related nuclear cataract, bilateral: Secondary | ICD-10-CM | POA: Diagnosis not present

## 2021-07-01 DIAGNOSIS — H25013 Cortical age-related cataract, bilateral: Secondary | ICD-10-CM | POA: Diagnosis not present

## 2021-07-01 DIAGNOSIS — H18413 Arcus senilis, bilateral: Secondary | ICD-10-CM | POA: Diagnosis not present

## 2021-07-02 ENCOUNTER — Other Ambulatory Visit: Payer: Self-pay | Admitting: Family Medicine

## 2021-07-02 DIAGNOSIS — M8580 Other specified disorders of bone density and structure, unspecified site: Secondary | ICD-10-CM

## 2021-07-02 DIAGNOSIS — Z780 Asymptomatic menopausal state: Secondary | ICD-10-CM

## 2021-07-30 ENCOUNTER — Other Ambulatory Visit: Payer: Self-pay | Admitting: Family Medicine

## 2021-08-18 ENCOUNTER — Other Ambulatory Visit: Payer: Self-pay | Admitting: Family Medicine

## 2021-09-09 IMAGING — MG DIGITAL SCREENING BILAT W/ CAD
2 series · 2 of 2 positions shown · non-contrast
Comparison: None.

CLINICAL DATA: Screening.

EXAM:
DIGITAL SCREENING BILATERAL MAMMOGRAM WITH CAD

[L MLO]
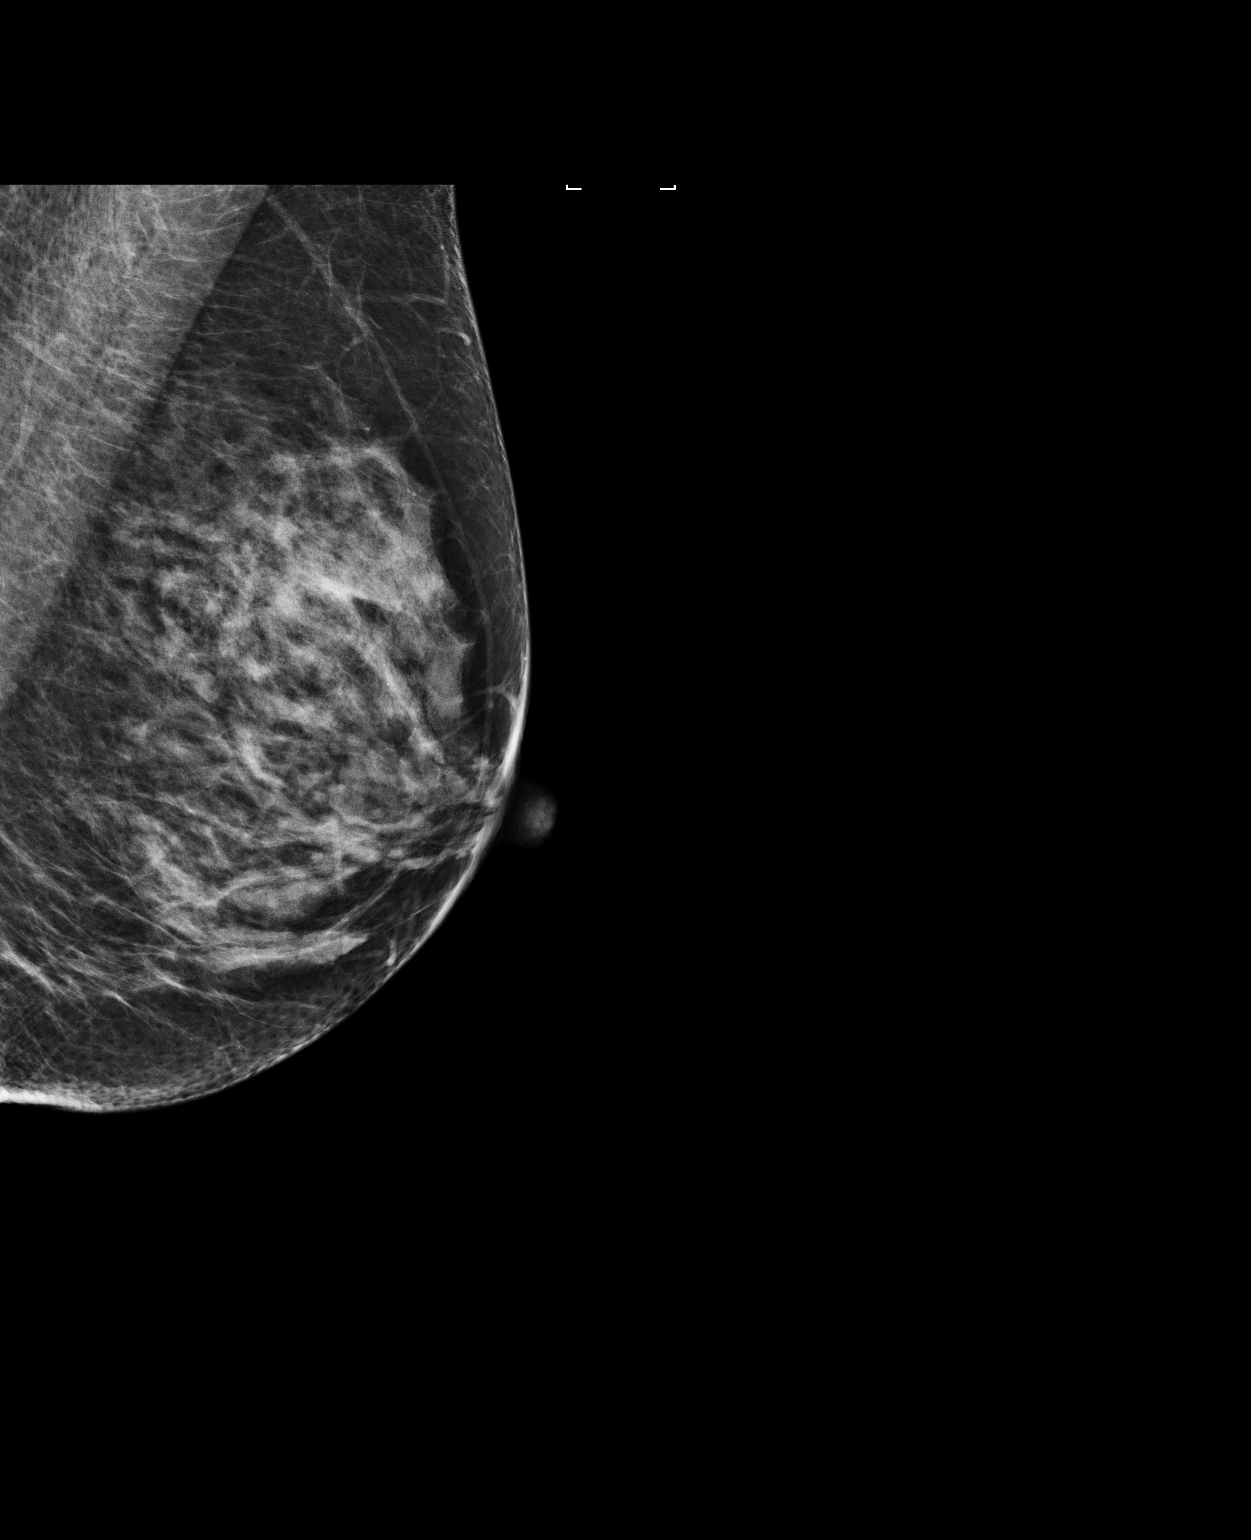

[R CC]
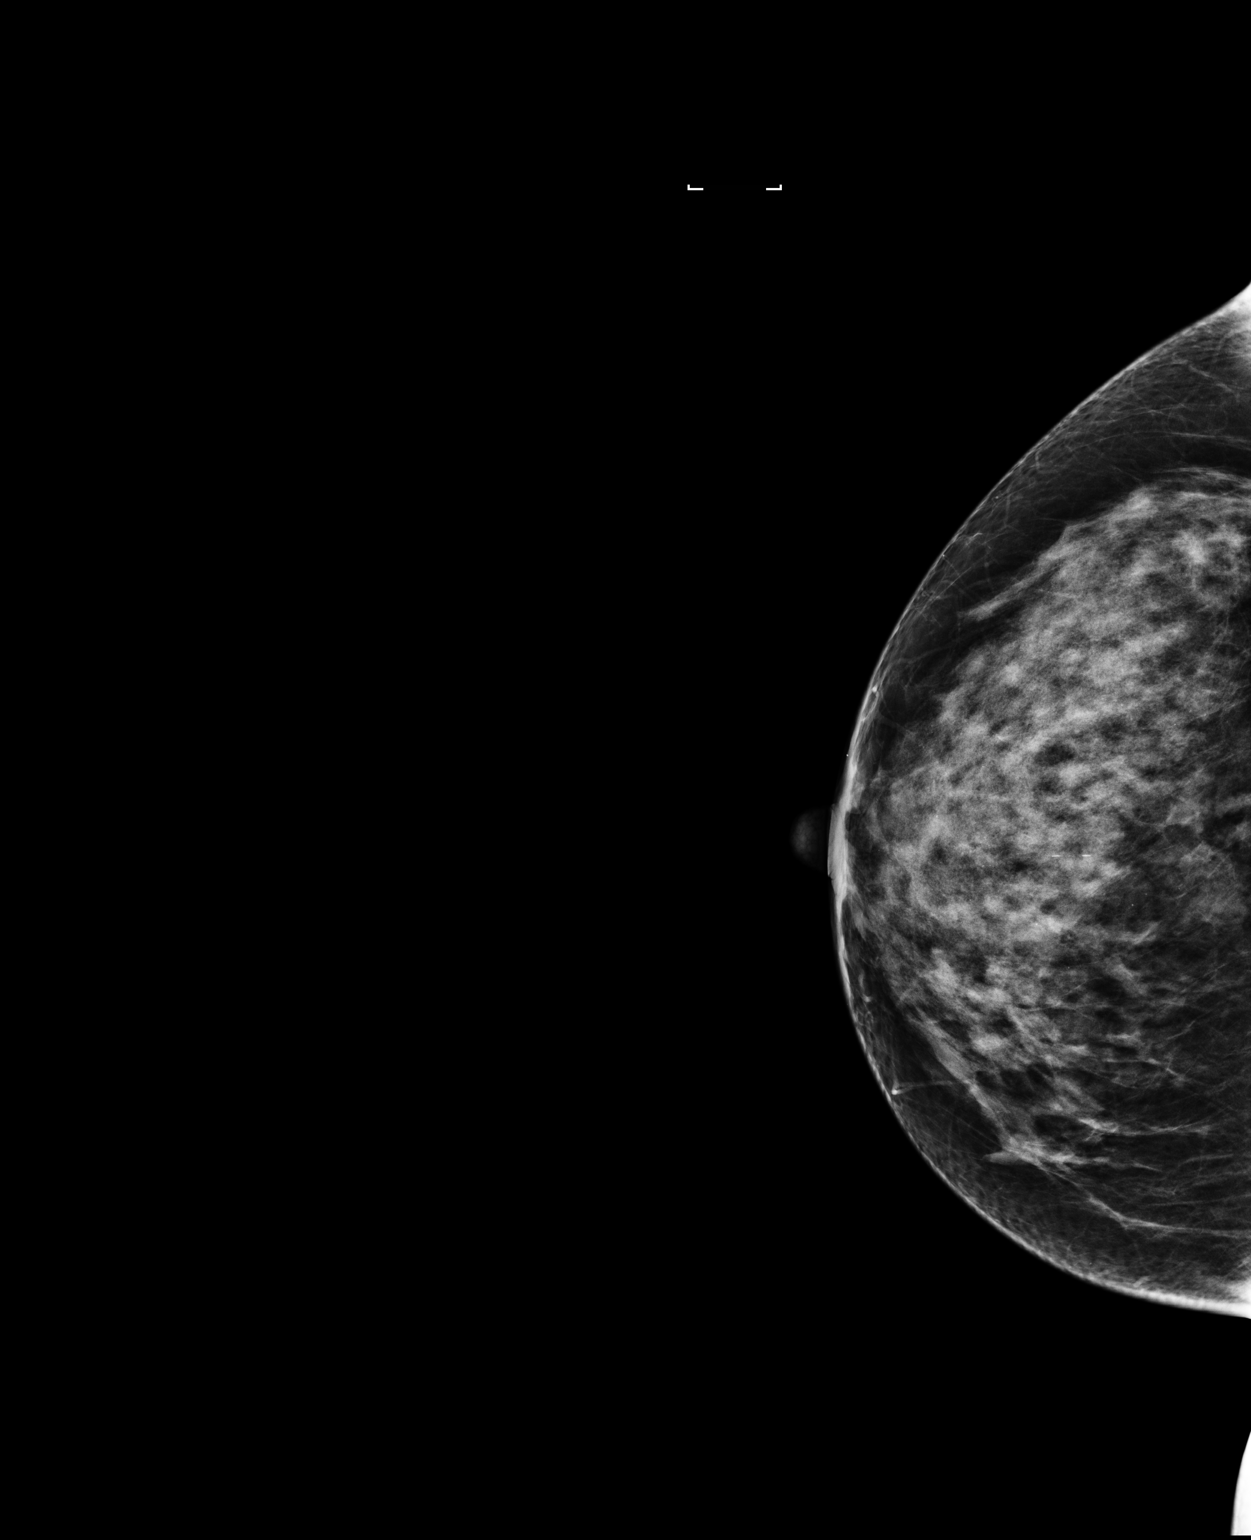

[2 of 2 positions shown; findings below may reference images not displayed]

ACR Breast Density Category d: The breast tissue is extremely dense,
which lowers the sensitivity of mammography.
FINDINGS: There are no findings suspicious for malignancy. Images were
processed with CAD.
IMPRESSION: No mammographic evidence of malignancy. A result letter of this
screening mammogram will be mailed directly to the patient.

RECOMMENDATION:
Screening mammogram in one year. (Code:8A-L-C49)

BI-RADS CATEGORY  1: Negative.

## 2021-09-22 DIAGNOSIS — H2512 Age-related nuclear cataract, left eye: Secondary | ICD-10-CM | POA: Diagnosis not present

## 2021-09-22 DIAGNOSIS — H2513 Age-related nuclear cataract, bilateral: Secondary | ICD-10-CM | POA: Diagnosis not present

## 2021-09-23 DIAGNOSIS — H2511 Age-related nuclear cataract, right eye: Secondary | ICD-10-CM | POA: Diagnosis not present

## 2021-10-06 DIAGNOSIS — H2511 Age-related nuclear cataract, right eye: Secondary | ICD-10-CM | POA: Diagnosis not present

## 2021-10-06 DIAGNOSIS — H52201 Unspecified astigmatism, right eye: Secondary | ICD-10-CM | POA: Diagnosis not present

## 2021-10-06 DIAGNOSIS — H2513 Age-related nuclear cataract, bilateral: Secondary | ICD-10-CM | POA: Diagnosis not present

## 2021-10-23 DIAGNOSIS — Z85828 Personal history of other malignant neoplasm of skin: Secondary | ICD-10-CM | POA: Diagnosis not present

## 2021-10-23 DIAGNOSIS — L821 Other seborrheic keratosis: Secondary | ICD-10-CM | POA: Diagnosis not present

## 2021-10-23 DIAGNOSIS — L430 Hypertrophic lichen planus: Secondary | ICD-10-CM | POA: Diagnosis not present

## 2021-10-23 DIAGNOSIS — D1801 Hemangioma of skin and subcutaneous tissue: Secondary | ICD-10-CM | POA: Diagnosis not present

## 2021-10-23 DIAGNOSIS — L570 Actinic keratosis: Secondary | ICD-10-CM | POA: Diagnosis not present

## 2021-10-23 DIAGNOSIS — L820 Inflamed seborrheic keratosis: Secondary | ICD-10-CM | POA: Diagnosis not present

## 2021-10-23 DIAGNOSIS — D485 Neoplasm of uncertain behavior of skin: Secondary | ICD-10-CM | POA: Diagnosis not present

## 2021-10-23 DIAGNOSIS — D225 Melanocytic nevi of trunk: Secondary | ICD-10-CM | POA: Diagnosis not present

## 2021-10-23 DIAGNOSIS — C44622 Squamous cell carcinoma of skin of right upper limb, including shoulder: Secondary | ICD-10-CM | POA: Diagnosis not present

## 2021-11-03 DIAGNOSIS — L988 Other specified disorders of the skin and subcutaneous tissue: Secondary | ICD-10-CM | POA: Diagnosis not present

## 2021-11-03 DIAGNOSIS — C44622 Squamous cell carcinoma of skin of right upper limb, including shoulder: Secondary | ICD-10-CM | POA: Diagnosis not present

## 2021-11-24 ENCOUNTER — Ambulatory Visit
Admission: RE | Admit: 2021-11-24 | Discharge: 2021-11-24 | Disposition: A | Discharge status: Home / Self Care | Payer: Medicare Other | Source: Ambulatory Visit | Attending: Family Medicine | Admitting: Family Medicine

## 2021-11-24 DIAGNOSIS — Z1231 Encounter for screening mammogram for malignant neoplasm of breast: Secondary | ICD-10-CM

## 2021-11-24 DIAGNOSIS — M8580 Other specified disorders of bone density and structure, unspecified site: Secondary | ICD-10-CM

## 2021-11-24 DIAGNOSIS — Z780 Asymptomatic menopausal state: Secondary | ICD-10-CM

## 2021-11-24 DIAGNOSIS — M8589 Other specified disorders of bone density and structure, multiple sites: Secondary | ICD-10-CM | POA: Diagnosis not present

## 2021-12-02 ENCOUNTER — Encounter: Payer: Self-pay | Admitting: Family Medicine

## 2022-02-16 ENCOUNTER — Encounter: Payer: Self-pay | Admitting: *Deleted

## 2022-03-23 DIAGNOSIS — L820 Inflamed seborrheic keratosis: Secondary | ICD-10-CM | POA: Diagnosis not present

## 2022-05-07 ENCOUNTER — Encounter: Payer: Self-pay | Admitting: *Deleted

## 2022-06-22 ENCOUNTER — Ambulatory Visit (INDEPENDENT_AMBULATORY_CARE_PROVIDER_SITE_OTHER): Payer: Medicare Other | Admitting: Family Medicine

## 2022-06-22 ENCOUNTER — Encounter: Payer: Self-pay | Admitting: Family Medicine

## 2022-06-22 VITALS — BP 144/80 | HR 60 | Temp 97.8°F | Ht 66.0 in | Wt 145.2 lb

## 2022-06-22 DIAGNOSIS — M816 Localized osteoporosis [Lequesne]: Secondary | ICD-10-CM

## 2022-06-22 DIAGNOSIS — K219 Gastro-esophageal reflux disease without esophagitis: Secondary | ICD-10-CM | POA: Diagnosis not present

## 2022-06-22 DIAGNOSIS — E782 Mixed hyperlipidemia: Secondary | ICD-10-CM | POA: Diagnosis not present

## 2022-06-22 DIAGNOSIS — E039 Hypothyroidism, unspecified: Secondary | ICD-10-CM | POA: Diagnosis not present

## 2022-06-22 DIAGNOSIS — F411 Generalized anxiety disorder: Secondary | ICD-10-CM

## 2022-06-22 DIAGNOSIS — Z0000 Encounter for general adult medical examination without abnormal findings: Secondary | ICD-10-CM

## 2022-06-22 DIAGNOSIS — R030 Elevated blood-pressure reading, without diagnosis of hypertension: Secondary | ICD-10-CM | POA: Diagnosis not present

## 2022-06-22 LAB — CBC WITH DIFFERENTIAL/PLATELET
Basophils Absolute: 0 10*3/uL (ref 0.0–0.1)
Basophils Relative: 0.8 % (ref 0.0–3.0)
Eosinophils Absolute: 0.1 10*3/uL (ref 0.0–0.7)
Eosinophils Relative: 1.1 % (ref 0.0–5.0)
HCT: 39.4 % (ref 36.0–46.0)
Hemoglobin: 13.4 g/dL (ref 12.0–15.0)
Lymphocytes Relative: 24.7 % (ref 12.0–46.0)
Lymphs Abs: 1.3 10*3/uL (ref 0.7–4.0)
MCHC: 33.9 g/dL (ref 30.0–36.0)
MCV: 87.3 fl (ref 78.0–100.0)
Monocytes Absolute: 0.2 10*3/uL (ref 0.1–1.0)
Monocytes Relative: 3.8 % (ref 3.0–12.0)
Neutro Abs: 3.8 10*3/uL (ref 1.4–7.7)
Neutrophils Relative %: 69.6 % (ref 43.0–77.0)
Platelets: 193 10*3/uL (ref 150.0–400.0)
RBC: 4.52 Mil/uL (ref 3.87–5.11)
RDW: 13.4 % (ref 11.5–15.5)
WBC: 5.4 10*3/uL (ref 4.0–10.5)

## 2022-06-22 LAB — LIPID PANEL
Cholesterol: 168 mg/dL (ref 0–200)
HDL: 78.5 mg/dL (ref >39.00)
LDL Cholesterol: 75 mg/dL (ref 0–99)
NonHDL: 89.23
Total CHOL/HDL Ratio: 2
Triglycerides: 73 mg/dL (ref 0.0–149.0)
VLDL: 14.6 mg/dL (ref 0.0–40.0)

## 2022-06-22 LAB — COMPREHENSIVE METABOLIC PANEL
ALT: 13 U/L (ref 0–35)
AST: 17 U/L (ref 0–37)
Albumin: 4.1 g/dL (ref 3.5–5.2)
Alkaline Phosphatase: 122 U/L — ABNORMAL HIGH (ref 39–117)
BUN: 22 mg/dL (ref 6–23)
CO2: 29 mEq/L (ref 19–32)
Calcium: 9.1 mg/dL (ref 8.4–10.5)
Chloride: 106 mEq/L (ref 96–112)
Creatinine, Ser: 0.88 mg/dL (ref 0.40–1.20)
GFR: 61.99 mL/min (ref >60.00)
Glucose, Bld: 89 mg/dL (ref 70–99)
Potassium: 4 mEq/L (ref 3.5–5.1)
Sodium: 143 mEq/L (ref 135–145)
Total Bilirubin: 0.5 mg/dL (ref 0.2–1.2)
Total Protein: 6.1 g/dL (ref 6.0–8.3)

## 2022-06-22 LAB — TSH: TSH: 0.64 u[IU]/mL (ref 0.35–5.50)

## 2022-06-22 MED ORDER — SERTRALINE HCL 50 MG PO TABS: 50.0000 mg | ORAL_TABLET | Freq: Every day | ORAL | 3 refills | Status: DC

## 2022-06-22 MED ORDER — LEVOTHYROXINE SODIUM 75 MCG PO TABS: 75.0000 ug | ORAL_TABLET | Freq: Every day | ORAL | 3 refills | Status: DC

## 2022-06-22 MED ORDER — ATORVASTATIN CALCIUM 10 MG PO TABS: 10.0000 mg | ORAL_TABLET | Freq: Every evening | ORAL | 3 refills | Status: DC

## 2022-06-22 MED ORDER — OMEPRAZOLE 20 MG PO CPDR: 20.0000 mg | DELAYED_RELEASE_CAPSULE | Freq: Every day | ORAL | 3 refills | Status: DC

## 2022-06-22 MED ORDER — ALENDRONATE SODIUM 70 MG PO TABS: 70.0000 mg | ORAL_TABLET | ORAL | 3 refills | Status: DC

## 2022-06-22 NOTE — Progress Notes (Signed)
Subjective  Chief Complaint  Patient presents with   Annual Exam    Pt here for Annual exam and is currently fasting    Hyperlipidemia    HPI: Patricia Patrick is a 82 y.o. female who presents to [facility] [place] today for a Female Wellness Visit. She also has the concerns and/or needs as listed above in the chief complaint. These will be addressed in addition to the Health Maintenance Visit.   Wellness Visit: annual visit with health maintenance review and exam without Pap  HM: defers mammo. May consider next year with dexa. Imms current. Doing great. No concerns Chronic disease f/u and/or acute problem visit: (deemed necessary to be done in addition to the wellness visit): Low thyroid: feels great. Compliant. HLD on statin w/o concerns. Some gerd sxs so restarting protonix. Reviewed dexa: wrist T=-2.6, h/o traumatic fracture. Spine and lumbar osteopenia persist.  GAD on zoloft and stable.   Assessment  1. Annual physical exam   2. Acquired hypothyroidism   3. Mixed hyperlipidemia   4. Localized osteoporosis without current pathological fracture   5. GAD (generalized anxiety disorder)   6. Gastroesophageal reflux disease without esophagitis   7. Elevated blood pressure reading without diagnosis of hypertension      Plan  Female Wellness Visit: Age appropriate Health Maintenance and Prevention measures were discussed with patient. Included topics are cancer screening recommendations, ways to keep healthy (see AVS) including dietary and exercise recommendations, regular eye and dental care, use of seat belts, and avoidance of moderate alcohol use and tobacco use.  BMI: discussed patient's BMI and encouraged positive lifestyle modifications to help get to or maintain a target BMI. HM needs and immunizations were addressed and ordered. See below for orders. See HM and immunization section for updates. Routine labs and screening tests ordered including cmp,  cbc and lipids where appropriate. Discussed recommendations regarding Vit D and calcium supplementation (see AVS)  Chronic disease management visit and/or acute problem visit: HLD On lipitor 10. Recheck lfts and lipids today.  GAD on zoloft 50 daiy. Continue and well controlled. Low thyroid on 63mg levotx daily. Recheck levels. Clinically euthyroid.  Osteoporosis, wrist. Rec treatment. Start fosamax, continue PPI and monitor for side effects. Ca and vit D and exercise recommended. GERD. Restart protonix 20 daily.  ? Developing HTN: start home monitoring. Return if remains elevated.   Follow up: 12 mo for cpe  Orders Placed This Encounter  Procedures   CBC with Differential/Platelet   Comprehensive metabolic panel   Lipid panel   TSH   Meds ordered this encounter  Medications   alendronate (FOSAMAX) 70 MG tablet    Sig: Take 1 tablet (70 mg total) by mouth every 7 (seven) days. Take with a full glass of water on an empty stomach.    Dispense:  12 tablet    Refill:  3   sertraline (ZOLOFT) 50 MG tablet    Sig: Take 1 tablet (50 mg total) by mouth daily.    Dispense:  90 tablet    Refill:  3   levothyroxine (SYNTHROID) 75 MCG tablet    Sig: Take 1 tablet (75 mcg total) by mouth daily.    Dispense:  90 tablet    Refill:  3   atorvastatin (LIPITOR) 10 MG tablet    Sig: Take 1 tablet (10 mg total) by mouth at bedtime.    Dispense:  90 tablet    Refill:  3   omeprazole (PRILOSEC)  20 MG capsule    Sig: Take 1 capsule (20 mg total) by mouth daily.    Dispense:  90 capsule    Refill:  3      Body mass index is 23.44 kg/m. Wt Readings from Last 3 Encounters:  06/22/22 145 lb 3.5 oz (65.9 kg)  06/19/21 142 lb 3.2 oz (64.5 kg)  06/13/21 141 lb (64 kg)     Patient Active Problem List   Diagnosis Date Noted   GERD (gastroesophageal reflux disease) 06/19/2021    Priority: Medium    Mixed hyperlipidemia 06/19/2021   History of postmenopausal HRT 06/19/2021   Acquired  hypothyroidism 06/02/2018   GAD (generalized anxiety disorder) 06/02/2018    Reports history of panic attacks and anxiety: Unclear reasons.  Started on Zoloft and is started well.  Started around 2012.  Has never tried coming off of the medication.  Currently well controlled.    Panic attack 06/02/2018   Osteoporosis 06/02/2018    DEXA 05/2016 lowest T = -1.8 10 FRAC score 1.8%, major frac score 9.9%; rec recheck 2 years and calcium and vit D. DEXA 08/2019 osteopenia lowest T=-1.4, FRAX score major 15%; stable, recheck 2 years.  DEXA 11/2021: Osteoporosis: Wrist T equals -2.6., started fosamax 05/2022 (Spine and femur: osteopenic range)    Health Maintenance  Topic Date Due   Medicare Annual Wellness (AWV)  06/13/2022   COVID-19 Vaccine (4 - 2023-24 season) 07/08/2022 (Originally 01/23/2022)   MAMMOGRAM  11/25/2022   DEXA SCAN  11/25/2023   DTaP/Tdap/Td (2 - Td or Tdap) 10/21/2025   Pneumonia Vaccine 33+ Years old  Completed   INFLUENZA VACCINE  Completed   Zoster Vaccines- Shingrix  Completed   HPV VACCINES  Aged Out   Immunization History  Administered Date(s) Administered   Fluad Quad(high Dose 65+) 02/14/2019, 02/11/2020, 02/12/2021   Influenza, High Dose Seasonal PF 02/21/2015, 03/23/2016   Influenza-Unspecified 02/21/2017, 04/08/2022   PFIZER(Purple Top)SARS-COV-2 Vaccination 06/16/2019, 07/07/2019, 02/11/2020   Pneumococcal Conjugate-13 10/22/2015   Pneumococcal Polysaccharide-23 03/24/2017   Tdap 10/22/2015   Zoster Recombinat (Shingrix) 06/02/2018, 08/01/2018   We updated and reviewed the patient's past history in detail and it is documented below. Allergies: Patient is allergic to lobster  [shellfish allergy]. Past Medical History Patient  has a past medical history of GAD (generalized anxiety disorder) (06/02/2018), Osteopenia (06/02/2018), and Thyroid disease. Past Surgical History Patient  has a past surgical history that includes Cholecystectomy (2009) and Abdominal  hysterectomy (2009). Family History: Patient family history includes Cancer in her sister; Healthy in her son and son; Heart attack in her father; Obesity in her mother. Social History:  Patient  reports that she has never smoked. She has never used smokeless tobacco. She reports current alcohol use. She reports that she does not use drugs.  Review of Systems: Constitutional: negative for fever or malaise Ophthalmic: negative for photophobia, double vision or loss of vision Cardiovascular: negative for chest pain, dyspnea on exertion, or new LE swelling Respiratory: negative for SOB or persistent cough Gastrointestinal: negative for abdominal pain, change in bowel habits or melena Genitourinary: negative for dysuria or gross hematuria, no abnormal uterine bleeding or disharge Musculoskeletal: negative for new gait disturbance or muscular weakness Integumentary: negative for new or persistent rashes, no breast lumps Neurological: negative for TIA or stroke symptoms Psychiatric: negative for SI or delusions Allergic/Immunologic: negative for hives  Patient Care Team    Relationship Specialty Notifications Start End  Leamon Arnt, MD PCP - General Family Medicine  06/02/18     Objective  Vitals: BP (!) 144/70   Pulse 60   Temp 97.8 F (36.6 C)   Ht '5\' 6"'$  (1.676 m)   Wt 145 lb 3.5 oz (65.9 kg)   SpO2 98%   BMI 23.44 kg/m  General:  Well developed, well nourished, no acute distress  Psych:  Alert and orientedx3,normal mood and affect HEENT:  Normocephalic, atraumatic, non-icteric sclera,  supple neck without adenopathy, mass or thyromegaly Cardiovascular:  Normal S1, S2, RRR without gallop, rub or murmur Respiratory:  Good breath sounds bilaterally, CTAB with normal respiratory effort Gastrointestinal: normal bowel sounds, soft, non-tender, no noted masses. No HSM MSK: no deformities, contusions. Joints are without erythema or swelling.  Skin:  Warm, no rashes or suspicious  lesions noted  Commons side effects, risks, benefits, and alternatives for medications and treatment plan prescribed today were discussed, and the patient expressed understanding of the given instructions. Patient is instructed to call or message via MyChart if he/she has any questions or concerns regarding our treatment plan. No barriers to understanding were identified. We discussed Red Flag symptoms and signs in detail. Patient expressed understanding regarding what to do in case of urgent or emergency type symptoms.  Medication list was reconciled, printed and provided to the patient in AVS. Patient instructions and summary information was reviewed with the patient as documented in the AVS. This note was prepared with assistance of Dragon voice recognition software. Occasional wrong-word or sound-a-like substitutions may have occurred due to the inherent limitations of voice recognition software

## 2022-06-22 NOTE — Patient Instructions (Signed)
Please return in 12 months for your annual complete physical; please come fasting.   I will release your lab results to you on your MyChart account with further instructions. You may see the results before I do, but when I review them I will send you a message with my report or have my assistant call you if things need to be discussed. Please reply to my message with any questions. Thank you!   If you have any questions or concerns, please don't hesitate to send me a message via MyChart or call the office at [phone]. Thank you for visiting with [place] today! It's our pleasure caring for you.   Osteoporosis  Osteoporosis happens when the bones become thin and less dense than normal. Osteoporosis makes bones more brittle and fragile and more likely to break (fracture). Over time, osteoporosis can cause your bones to become so weak that they fracture after a minor fall. Bones in the hip, wrist, and spine are most likely to fracture due to osteoporosis. What are the causes? The exact cause of this condition is not known. What increases the risk? You are more likely to develop this condition if you: Have family members with this condition. Have poor nutrition. Use the following: Steroid medicines, such as prednisone. Anti-seizure medicines. Nicotine or tobacco, such as cigarettes, e-cigarettes, and chewing tobacco. Are female. Are age 30 or older. Are not physically active (are sedentary). Are of European or Asian descent. Have a small body frame. What are the signs or symptoms? A fracture might be the first sign of osteoporosis, especially if the fracture results from a fall or injury that usually would not cause a bone to break. Other signs and symptoms include: Pain in the neck or low back. Stooped posture. Loss of height. How is this diagnosed? This condition may be diagnosed based on: Your medical history. A physical exam. A bone mineral density test, also called a DXA or DEXA test  (dual-energy X-ray absorptiometry test). This test uses X-rays to measure the amount of minerals in your bones. How is this treated? This condition may be treated by: Making lifestyle changes, such as: Including foods with more calcium and vitamin D in your diet. Doing weight-bearing and muscle-strengthening exercises. Stopping tobacco use. Limiting alcohol intake. Taking medicine to slow the process of bone loss or to increase bone density. Taking daily supplements of calcium and vitamin D. Taking hormone replacement medicines, such as estrogen for women and testosterone for men. Monitoring your levels of calcium and vitamin D. The goal of treatment is to strengthen your bones and lower your risk for a fracture. Follow these instructions at home: Eating and drinking Include calcium and vitamin D in your diet. Calcium is important for bone health, and vitamin D helps your body absorb calcium. Good sources of calcium and vitamin D include: Certain fatty fish, such as salmon and tuna. Products that have calcium and vitamin D added to them (are fortified), such as fortified cereals. Egg yolks. Cheese. Liver.  Activity Do exercises as told by your health care provider. Ask your health care provider what exercises and activities are safe for you. You should do: Exercises that make you work against gravity (weight-bearing exercises), such as tai chi, yoga, or walking. Exercises to strengthen muscles, such as lifting weights. Lifestyle Do not drink alcohol if: Your health care provider tells you not to drink. You are pregnant, may be pregnant, or are planning to become pregnant. If you drink alcohol: Limit how much you  use to: 0-1 drink a day for women. 0-2 drinks a day for men. Know how much alcohol is in your drink. In the [place] one drink equals one 12 oz bottle of beer (355 mL), one 5 oz glass of wine (148 mL), or one 1 oz glass of hard liquor (44 mL). Do not use any products that  contain nicotine or tobacco, such as cigarettes, e-cigarettes, and chewing tobacco. If you need help quitting, ask your health care provider. Preventing falls Use devices to help you move around (mobility aids) as needed, such as canes, walkers, scooters, or crutches. Keep rooms well-lit and clutter-free. Remove tripping hazards from walkways, including cords and throw rugs. Install grab bars in bathrooms and safety rails on stairs. Use rubber mats in the bathroom and other areas that are often wet or slippery. Wear closed-toe shoes that fit well and support your feet. Wear shoes that have rubber soles or low heels. Review your medicines with your health care provider. Some medicines can cause dizziness or changes in blood pressure, which can increase your risk of falling. General instructions Take over-the-counter and prescription medicines only as told by your health care provider. Keep all follow-up visits. This is important. Contact a health care provider if: You have never been screened for osteoporosis and you are: A woman who is age 19 or older. A man who is age 4 or older. Get help right away if: You fall or injure yourself. Summary Osteoporosis is thinning and loss of density in your bones. This makes bones more brittle and fragile and more likely to break (fracture),even with minor falls. The goal of treatment is to strengthen your bones and lower your risk for a fracture. Include calcium and vitamin D in your diet. Calcium is important for bone health, and vitamin D helps your body absorb calcium. Talk with your health care provider about screening for osteoporosis if you are a woman who is age 74 or older, or a man who is age 18 or older. This information is not intended to replace advice given to you by your health care provider. Make sure you discuss any questions you have with your health care provider. Document Revised: 10/26/2019 Document Reviewed: 10/26/2019 Elsevier  Patient Education  [place].

## 2022-07-20 ENCOUNTER — Ambulatory Visit (INDEPENDENT_AMBULATORY_CARE_PROVIDER_SITE_OTHER): Payer: Medicare Other

## 2022-07-20 VITALS — Wt 145.0 lb

## 2022-07-20 DIAGNOSIS — Z0000 Encounter for general adult medical examination without abnormal findings: Secondary | ICD-10-CM

## 2022-07-20 NOTE — Patient Instructions (Signed)
Patricia Patrick , Thank you for taking time to come for your Medicare Wellness Visit. I appreciate your ongoing commitment to your health goals. Please review the following plan we discussed and let me know if I can assist you in the future.   These are the goals we discussed:  Goals      Patient Stated     Maintain health and enjoy time with family      Patient Stated     Lose weight     Patient Stated     Exercise more      Patient Stated     Continue walking for exercise         This is a list of the screening recommended for you and due dates:  Health Maintenance  Topic Date Due   COVID-19 Vaccine (4 - 2023-24 season) 01/23/2022   Mammogram  11/25/2022   Medicare Annual Wellness Visit  07/21/2023   DEXA scan (bone density measurement)  11/25/2023   DTaP/Tdap/Td vaccine (2 - Td or Tdap) 10/21/2025   Pneumonia Vaccine  Completed   Flu Shot  Completed   Zoster (Shingles) Vaccine  Completed   HPV Vaccine  Aged Out    Advanced directives: Please bring a copy of your health care power of attorney and living will to the office at your convenience.  Conditions/risks identified: continue walking for exercise   Next appointment: Follow up in one year for your annual wellness visit    Preventive Care 65 Years and Older, Female Preventive care refers to lifestyle choices and visits with your health care provider that can promote health and wellness. What does preventive care include? A yearly physical exam. This is also called an annual well check. Dental exams once or twice a year. Routine eye exams. Ask your health care provider how often you should have your eyes checked. Personal lifestyle choices, including: Daily care of your teeth and gums. Regular physical activity. Eating a healthy diet. Avoiding tobacco and drug use. Limiting alcohol use. Practicing safe sex. Taking low-dose aspirin every day. Taking vitamin and mineral supplements as recommended by your  health care provider. What happens during an annual well check? The services and screenings done by your health care provider during your annual well check will depend on your age, overall health, lifestyle risk factors, and family history of disease. Counseling  Your health care provider may ask you questions about your: Alcohol use. Tobacco use. Drug use. Emotional well-being. Home and relationship well-being. Sexual activity. Eating habits. History of falls. Memory and ability to understand (cognition). Work and work Statistician. Reproductive health. Screening  You may have the following tests or measurements: Height, weight, and BMI. Blood pressure. Lipid and cholesterol levels. These may be checked every 5 years, or more frequently if you are over 46 years old. Skin check. Lung cancer screening. You may have this screening every year starting at age 39 if you have a 30-pack-year history of smoking and currently smoke or have quit within the past 15 years. Fecal occult blood test (FOBT) of the stool. You may have this test every year starting at age 80. Flexible sigmoidoscopy or colonoscopy. You may have a sigmoidoscopy every 5 years or a colonoscopy every 10 years starting at age 12. Hepatitis C blood test. Hepatitis B blood test. Sexually transmitted disease (STD) testing. Diabetes screening. This is done by checking your blood sugar (glucose) after you have not eaten for a while (fasting). You may have this done every  1-3 years. Bone density scan. This is done to screen for osteoporosis. You may have this done starting at age 8. Mammogram. This may be done every 1-2 years. Talk to your health care provider about how often you should have regular mammograms. Talk with your health care provider about your test results, treatment options, and if necessary, the need for more tests. Vaccines  Your health care provider may recommend certain vaccines, such as: Influenza vaccine.  This is recommended every year. Tetanus, diphtheria, and acellular pertussis (Tdap, Td) vaccine. You may need a Td booster every 10 years. Zoster vaccine. You may need this after age 64. Pneumococcal 13-valent conjugate (PCV13) vaccine. One dose is recommended after age 84. Pneumococcal polysaccharide (PPSV23) vaccine. One dose is recommended after age 42. Talk to your health care provider about which screenings and vaccines you need and how often you need them. This information is not intended to replace advice given to you by your health care provider. Make sure you discuss any questions you have with your health care provider. Document Released: 06/07/2015 Document Revised: 01/29/2016 Document Reviewed: 03/12/2015 Elsevier Interactive Patient Education  2017 [place] Prevention in the Home Falls can cause injuries. They can happen to people of all ages. There are many things you can do to make your home safe and to help prevent falls. What can I do on the outside of my home? Regularly fix the edges of walkways and driveways and fix any cracks. Remove anything that might make you trip as you walk through a door, such as a raised step or threshold. Trim any bushes or trees on the path to your home. Use bright outdoor lighting. Clear any walking paths of anything that might make someone trip, such as rocks or tools. Regularly check to see if handrails are loose or broken. Make sure that both sides of any steps have handrails. Any raised decks and porches should have guardrails on the edges. Have any leaves, snow, or ice cleared regularly. Use sand or salt on walking paths during winter. Clean up any spills in your garage right away. This includes oil or grease spills. What can I do in the bathroom? Use night lights. Install grab bars by the toilet and in the tub and shower. Do not use towel bars as grab bars. Use non-skid mats or decals in the tub or shower. If you need to sit  down in the shower, use a plastic, non-slip stool. Keep the floor dry. Clean up any water that spills on the floor as soon as it happens. Remove soap buildup in the tub or shower regularly. Attach bath mats securely with double-sided non-slip rug tape. Do not have throw rugs and other things on the floor that can make you trip. What can I do in the bedroom? Use night lights. Make sure that you have a light by your bed that is easy to reach. Do not use any sheets or blankets that are too big for your bed. They should not hang down onto the floor. Have a firm chair that has side arms. You can use this for support while you get dressed. Do not have throw rugs and other things on the floor that can make you trip. What can I do in the kitchen? Clean up any spills right away. Avoid walking on wet floors. Keep items that you use a lot in easy-to-reach places. If you need to reach something above you, use a strong step stool that has a  grab bar. Keep electrical cords out of the way. Do not use floor polish or wax that makes floors slippery. If you must use wax, use non-skid floor wax. Do not have throw rugs and other things on the floor that can make you trip. What can I do with my stairs? Do not leave any items on the stairs. Make sure that there are handrails on both sides of the stairs and use them. Fix handrails that are broken or loose. Make sure that handrails are as long as the stairways. Check any carpeting to make sure that it is firmly attached to the stairs. Fix any carpet that is loose or worn. Avoid having throw rugs at the top or bottom of the stairs. If you do have throw rugs, attach them to the floor with carpet tape. Make sure that you have a light switch at the top of the stairs and the bottom of the stairs. If you do not have them, ask someone to add them for you. What else can I do to help prevent falls? Wear shoes that: Do not have high heels. Have rubber bottoms. Are  comfortable and fit you well. Are closed at the toe. Do not wear sandals. If you use a stepladder: Make sure that it is fully opened. Do not climb a closed stepladder. Make sure that both sides of the stepladder are locked into place. Ask someone to hold it for you, if possible. Clearly mark and make sure that you can see: Any grab bars or handrails. First and last steps. Where the edge of each step is. Use tools that help you move around (mobility aids) if they are needed. These include: Canes. Walkers. Scooters. Crutches. Turn on the lights when you go into a dark area. Replace any light bulbs as soon as they burn out. Set up your furniture so you have a clear path. Avoid moving your furniture around. If any of your floors are uneven, fix them. If there are any pets around you, be aware of where they are. Review your medicines with your doctor. Some medicines can make you feel dizzy. This can increase your chance of falling. Ask your doctor what other things that you can do to help prevent falls. This information is not intended to replace advice given to you by your health care provider. Make sure you discuss any questions you have with your health care provider. Document Released: 03/07/2009 Document Revised: 10/17/2015 Document Reviewed: 06/15/2014 Elsevier Interactive Patient Education  2017 [employer].

## 2022-07-20 NOTE — Progress Notes (Signed)
I connected with  Nicholaus Bloom on 07/20/22 by a audio enabled telemedicine application and verified that I am speaking with the correct person using two identifiers.  Patient Location: Home  Provider Location: Office/Clinic  I discussed the limitations of evaluation and management by telemedicine. The patient expressed understanding and agreed to proceed.   Subjective:   VALITA MCCRAY is a 82 y.o. female who presents for Medicare Annual (Subsequent) preventive examination.  Review of Systems     Cardiac Risk Factors include: advanced age (>69mn, >>40women);dyslipidemia     Objective:    Today's Vitals   07/20/22 0909  Weight: 145 lb (65.8 kg)   Body mass index is 23.4 kg/m.     07/20/2022    9:13 AM 06/13/2021   11:22 AM 06/07/2020   11:09 AM 02/14/2019   10:41 AM 12/31/2015    9:42 PM  Advanced Directives  Does Patient Have a Medical Advance Directive? Yes Yes Yes Yes No  Type of [place] Healthcare Power of [place] will   Does patient want to make changes to medical advance directive?    No - Patient declined   Copy of HIonein Chart? No - copy requested No - copy requested No - copy requested      Current Medications (verified) Outpatient Encounter Medications as of 07/20/2022  Medication Sig   alendronate (FOSAMAX) 70 MG tablet Take 1 tablet (70 mg total) by mouth every 7 (seven) days. Take with a full glass of water on an empty stomach.   atorvastatin (LIPITOR) 10 MG tablet Take 1 tablet (10 mg total) by mouth at bedtime.   FLUZONE HIGH-DOSE QUADRIVALENT 0.7 ML SUSY    levothyroxine (SYNTHROID) 75 MCG tablet Take 1 tablet (75 mcg total) by mouth daily.   omeprazole (PRILOSEC) 20 MG capsule Take 1 capsule (20 mg total) by mouth daily.   sertraline (ZOLOFT) 50 MG tablet Take 1 tablet (50 mg total) by mouth daily.   No facility-administered  encounter medications on file as of 07/20/2022.    Allergies (verified) Lobster  [shellfish allergy]   History: Past Medical History:  Diagnosis Date   GAD (generalized anxiety disorder) 06/02/2018   Osteopenia 06/02/2018   DEXA 05/2016 lowest T = -1.8 10 FRAC score 1.8%, major frac score 9.9%; rec recheck 2 years and calcium and vit D.   Thyroid disease    Past Surgical History:  Procedure Laterality Date   ABDOMINAL HYSTERECTOMY  2009   CHOLECYSTECTOMY  2009   Family History  Problem Relation Age of Onset   Obesity Mother    Heart attack Father    Cancer Sister    Healthy Son    Healthy Son    Social History   Socioeconomic History   Marital status: Married    Spouse name: Not on file   Number of children: Not on file   Years of education: Not on file   Highest education level: Not on file  Occupational History   Occupation: retired  Tobacco Use   Smoking status: Never   Smokeless tobacco: Never  Substance and Sexual Activity   Alcohol use: Yes   Drug use: No   Sexual activity: Not on file  Other Topics Concern   Not on file  Social History Narrative   [place]   [place] twin sisters who live in the area    Social Determinants  of Health   Financial Resource Strain: Low Risk  (07/20/2022)   Overall Financial Resource Strain (CARDIA)    Difficulty of Paying Living Expenses: Not hard at all  Food Insecurity: No Food Insecurity (07/20/2022)   Hunger Vital Sign    Worried About Running Out of Food in the Last Year: Never true    Ran Out of Food in the Last Year: Never true  Transportation Needs: No Transportation Needs (07/20/2022)   PRAPARE - Hydrologist (Medical): No    Lack of Transportation (Non-Medical): No  Physical Activity: Insufficiently Active (07/20/2022)   Exercise Vital Sign    Days of Exercise per Week: 5 days    Minutes of Exercise per Session: 20 min  Stress: No Stress Concern Present (07/20/2022)    [place]    Feeling of Stress : Not at all  Social Connections: [place] (07/20/2022)   Social Connection and Isolation Panel [NHANES]    Frequency of Communication with Friends and Family: More than three times a week    Frequency of Social Gatherings with Friends and Family: More than three times a week    Attends Religious Services: More than 4 times per year    Active Member of [employer] or Organizations: Yes    Attends Archivist Meetings: 1 to 4 times per year    Marital Status: Married    Tobacco Counseling Counseling given: Not Answered   Clinical Intake:  Pre-visit preparation completed: Yes  Pain : No/denies pain     BMI - recorded: 23.4 Nutritional Status: BMI of 19-24  Normal Nutritional Risks: None Diabetes: No  How often do you need to have someone help you when you read instructions, pamphlets, or other written materials from your doctor or pharmacy?: 1 - Never  Diabetic?no  Interpreter Needed?: No  Information entered by :: Charlott Rakes, LPN   Activities of Daily Living    07/20/2022    9:14 AM  In your present state of health, do you have any difficulty performing the following activities:  Hearing? 0  Vision? 0  Difficulty concentrating or making decisions? 0  Walking or climbing stairs? 0  Dressing or bathing? 0  Doing errands, shopping? 0  Preparing Food and eating ? N  Using the Toilet? N  In the past six months, have you accidently leaked urine? Y  Comment sneezing and coughing  Do you have problems with loss of bowel control? N  Managing your Medications? N  Managing your Finances? N  Housekeeping or managing your Housekeeping? N    Patient Care Team: Leamon Arnt, MD as PCP - General (Family Medicine)  Indicate any recent Medical Services you may have received from other than [facility] providers in the past year (date may be approximate).      Assessment:   This is a routine wellness examination for [place].  Hearing/Vision screen Hearing Screening - Comments:: Pt denies any hearing issues  Vision Screening - Comments:: Pt follows up with Dr Blair Heys for annual eye exams   Dietary issues and exercise activities discussed: Current Exercise Habits: Home exercise routine, Type of exercise: walking, Time (Minutes): 20, Frequency (Times/Week): 5, Weekly Exercise (Minutes/Week): 100   Goals Addressed             This Visit's Progress    Patient Stated       Continue walking for exercise  Depression Screen    07/20/2022    9:12 AM 06/22/2022   10:00 AM 06/13/2021   11:21 AM 06/13/2020    9:23 AM 06/07/2020   11:08 AM 02/14/2019   10:41 AM 08/02/2018    1:19 PM  PHQ 2/9 Scores  PHQ - 2 Score 0 0 0 0 0 0 0    Fall Risk    07/20/2022    9:13 AM 06/22/2022   10:00 AM 06/13/2021   11:23 AM 06/13/2020    9:22 AM 06/07/2020   11:11 AM  Fall Risk   Falls in the past year? 0 0 0 0 0  Number falls in past yr: 0 0 0 0 0  Injury with Fall? 0 0 0 0 0  Risk for fall due to : Impaired vision No Fall Risks Impaired vision  Impaired vision  Follow up Falls prevention discussed  Falls prevention discussed  Falls prevention discussed    FALL RISK PREVENTION PERTAINING TO THE HOME:  Any stairs in or around the home? Yes  If so, are there any without handrails? No  Home free of loose throw rugs in walkways, pet beds, electrical cords, etc? Yes  Adequate lighting in your home to reduce risk of falls? Yes   ASSISTIVE DEVICES UTILIZED TO PREVENT FALLS:  Life alert? No  Use of a cane, walker or w/c? No  Grab bars in the bathroom? Yes  Shower chair or bench in shower? No  Elevated toilet seat or a handicapped toilet? No   TIMED UP AND GO:  Was the test performed? No .   Cognitive Function:    02/14/2019   10:42 AM  MMSE - Mini Mental State Exam  Orientation to time 5  Orientation to Place 5  Registration 3   Attention/ Calculation 5  Recall 3  Language- name 2 objects 2  Language- repeat 1  Language- follow 3 step command 3  Language- read & follow direction 1  Write a sentence 1  Copy design 1  Total score 30        07/20/2022    9:14 AM 06/13/2021   11:25 AM 06/07/2020   11:14 AM  6CIT Screen  What Year? 0 points 0 points 0 points  What month? 0 points 0 points 0 points  What time? 0 points 0 points   Count back from 20 0 points 0 points 0 points  Months in reverse 0 points 0 points 0 points  Repeat phrase 0 points 2 points 0 points  Total Score 0 points 2 points     Immunizations Immunization History  Administered Date(s) Administered   Fluad Quad(high Dose 65+) 02/14/2019, 02/11/2020, 02/12/2021   Influenza, High Dose Seasonal PF 02/21/2015, 03/23/2016   Influenza-Unspecified 02/21/2017, 04/08/2022   PFIZER(Purple Top)SARS-COV-2 Vaccination 06/16/2019, 07/07/2019, 02/11/2020   Pneumococcal Conjugate-13 10/22/2015   Pneumococcal Polysaccharide-23 03/24/2017   Tdap 10/22/2015   Zoster Recombinat (Shingrix) 06/02/2018, 08/01/2018    TDAP status: Up to date  Flu Vaccine status: Up to date  Pneumococcal vaccine status: Up to date  Covid-19 vaccine status: Completed vaccines  Qualifies for Shingles Vaccine? Yes   Zostavax completed Yes   Shingrix Completed?: Yes  Screening Tests Health Maintenance  Topic Date Due   COVID-19 Vaccine (4 - 2023-24 season) 01/23/2022   MAMMOGRAM  11/25/2022   Medicare Annual Wellness (AWV)  07/21/2023   DEXA SCAN  11/25/2023   DTaP/Tdap/Td (2 - Td or Tdap) 10/21/2025   Pneumonia Vaccine 74+ Years old  Completed   INFLUENZA VACCINE  Completed   Zoster Vaccines- Shingrix  Completed   HPV VACCINES  Aged Out    Health Maintenance  Health Maintenance Due  Topic Date Due   COVID-19 Vaccine (4 - 2023-24 season) 01/23/2022    Colorectal cancer screening: No longer required.   Mammogram status: Completed 11/24/21. Repeat every  year  Bone Density status: Completed 11/24/21. Results reflect: Bone density results: OSTEOPOROSIS. Repeat every 2 years.   Additional Screening:  Vision Screening: Recommended annual ophthalmology exams for early detection of glaucoma and other disorders of the eye. Is the patient up to date with their annual eye exam?  Yes  Who is the provider or what is the name of the office in which the patient attends annual eye exams? Dr Blair Heys  If pt is not established with a provider, would they like to be referred to a provider to establish care? No .   Dental Screening: Recommended annual dental exams for proper oral hygiene  Community Resource Referral / Chronic Care Management: CRR required this visit?  No   CCM required this visit?  No      Plan:     I have personally reviewed and noted the following in the patient's chart:   Medical and social history Use of alcohol, tobacco or illicit drugs  Current medications and supplements including opioid prescriptions. Patient is not currently taking opioid prescriptions. Functional ability and status Nutritional status Physical activity Advanced directives List of other physicians Hospitalizations, surgeries, and ER visits in previous 12 months Vitals Screenings to include cognitive, depression, and falls Referrals and appointments  In addition, I have reviewed and discussed with patient certain preventive protocols, quality metrics, and best practice recommendations. A written personalized care plan for preventive services as well as general preventive health recommendations were provided to patient.     Willette Brace, LPN   [id number]   Nurse Notes: none

## 2022-08-05 ENCOUNTER — Other Ambulatory Visit: Payer: Self-pay | Admitting: Family Medicine

## 2022-08-07 ENCOUNTER — Other Ambulatory Visit: Payer: Self-pay | Admitting: Family Medicine

## 2022-10-14 IMAGING — CT CT ANGIO CHEST
2 of 7 series · 13 of 36 positions shown · IV contrast (omnipaque)
Comparison: None.

CLINICAL DATA: 71-year-old female with high probability suspicion
for pulmonary embolus

EXAM:
CT ANGIOGRAPHY CHEST WITH CONTRAST
TECHNIQUE: Multidetector CT imaging of the chest was performed using the
standard protocol during bolus administration of intravenous
contrast. Multiplanar CT image reconstructions and MIPs were
obtained to evaluate the vascular anatomy.
CONTRAST:  60mL OMNIPAQUE IOHEXOL 350 MG/ML SOLN

[Series 5: pe axial thins · axial · 0.72mm/px · z∈[+1094,+1358]mm · 12 of 313 slices shown]
[im 25/313  lung]
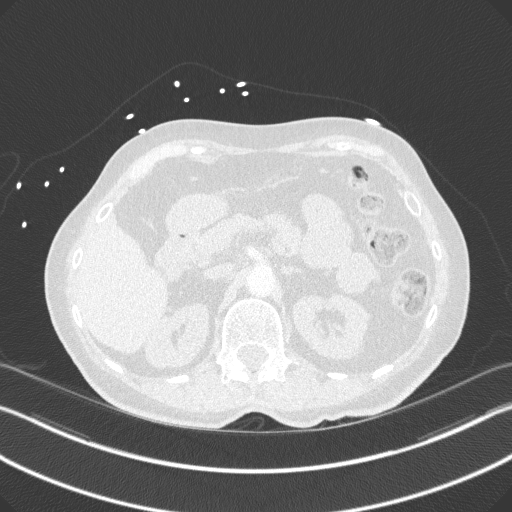
[im 49/313  mediastinal]
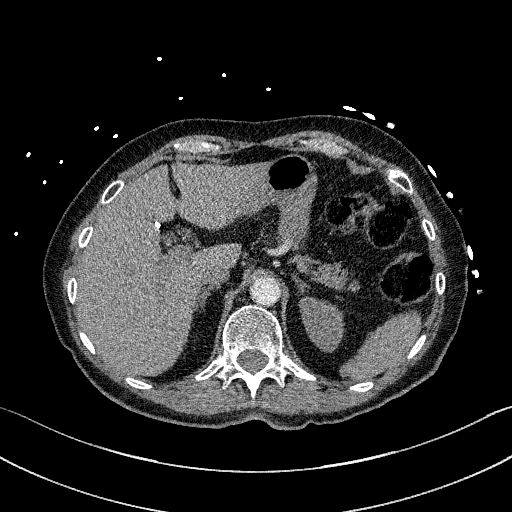
[im 73/313  lung]
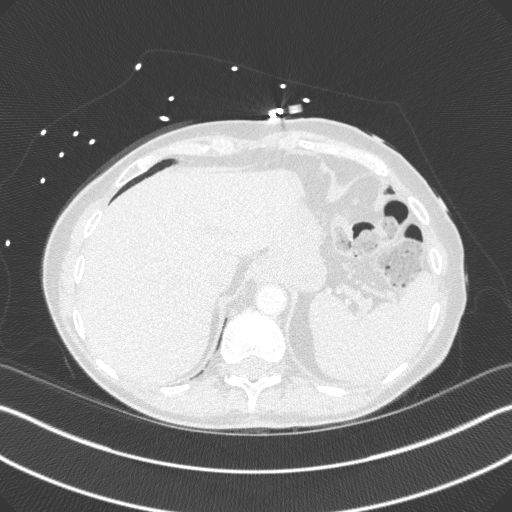
[im 97/313  mediastinal]
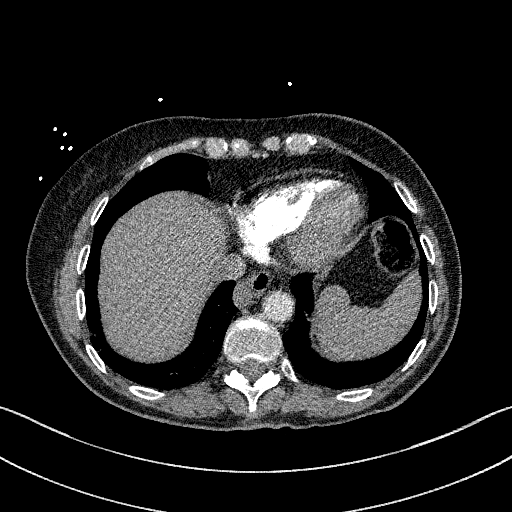
[im 121/313  lung]
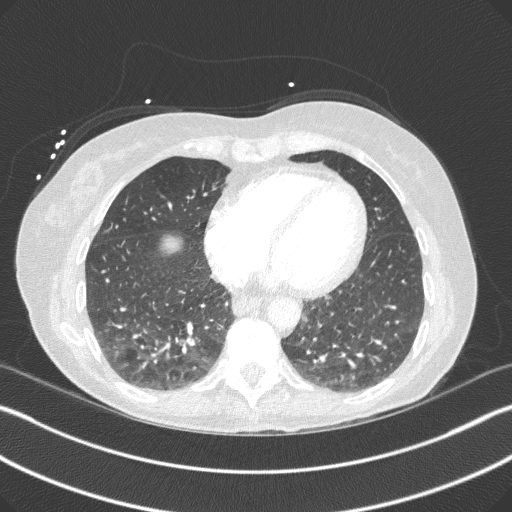
[im 145/313  mediastinal]
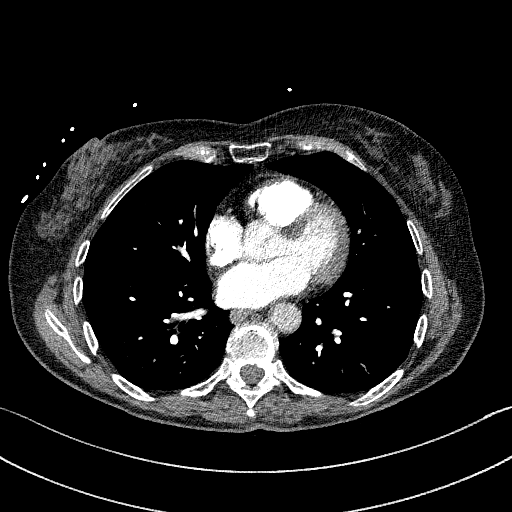
[im 169/313  lung]
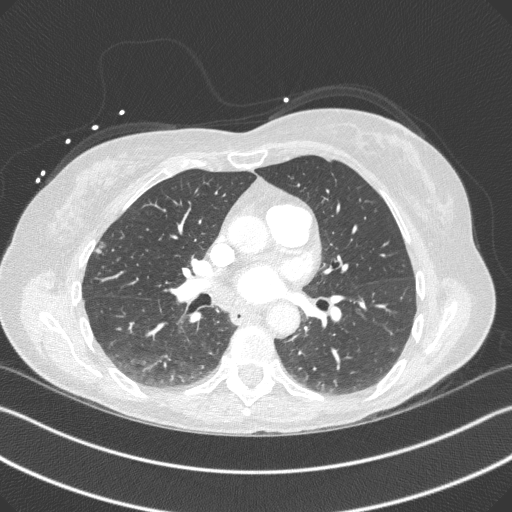
[im 193/313  mediastinal]
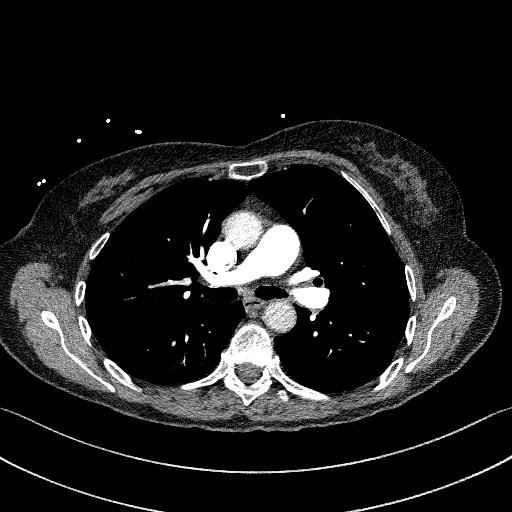
[im 217/313  lung]
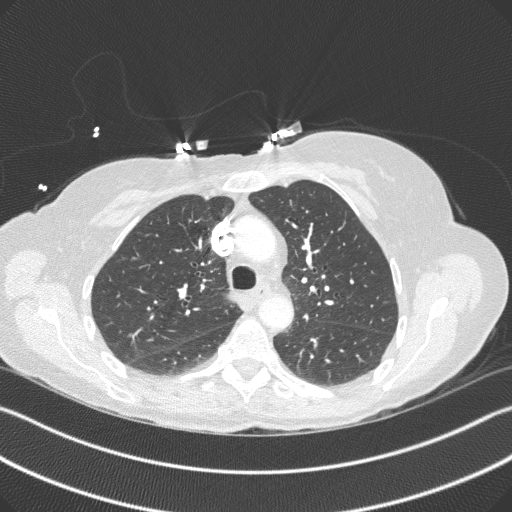
[im 241/313  mediastinal]
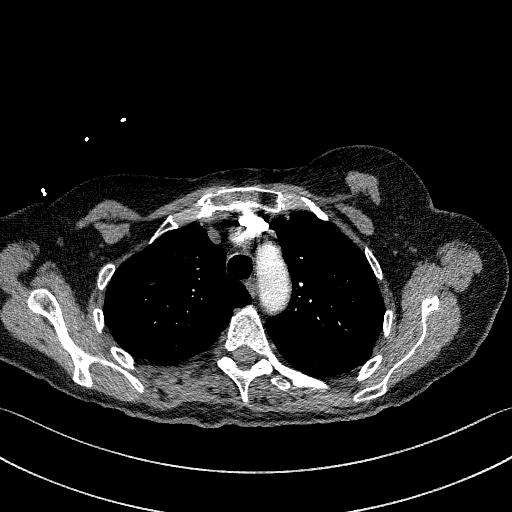
[im 265/313  lung]
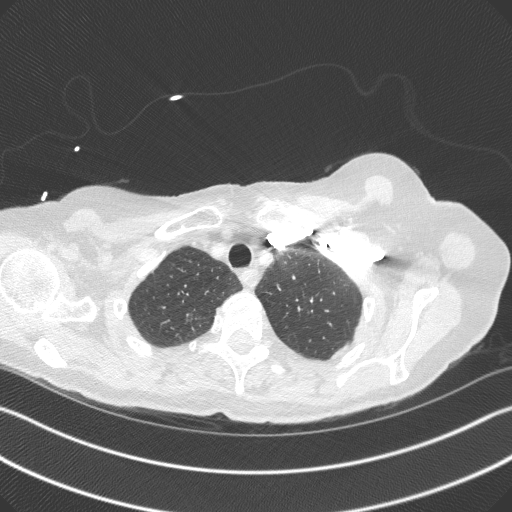
[im 289/313  mediastinal]
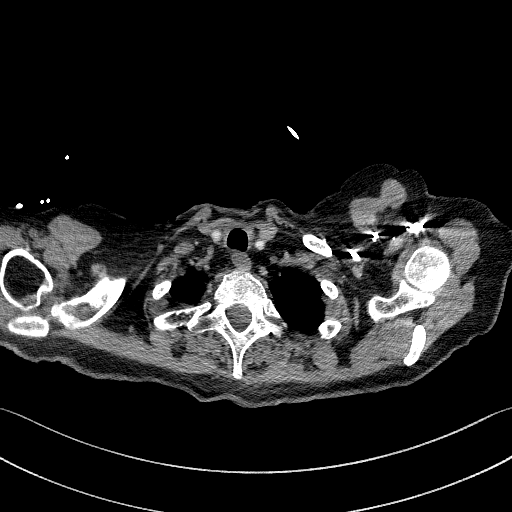

[Series 7: cor soft · coronal · 0.62mm/px · 1 of 120 slices shown]
[im 60/120  mediastinal]
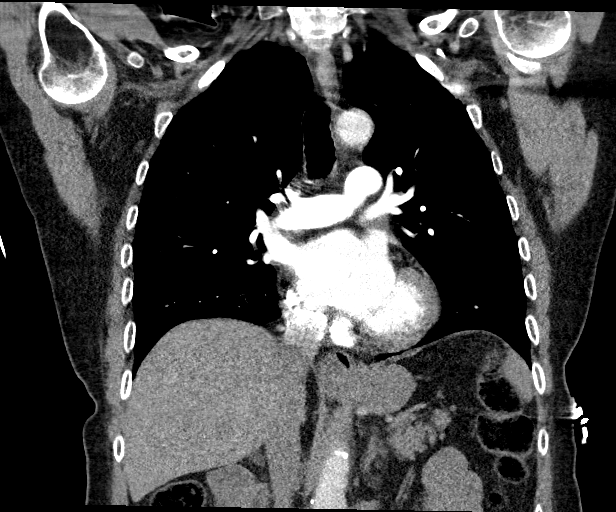

[13 of 36 positions shown; findings below may reference images not displayed]

FINDINGS: Cardiovascular: Satisfactory opacification of the pulmonary arteries
to the segmental level. No evidence of pulmonary embolism. Normal
heart size. No pericardial effusion. Calcified atherosclerotic
plaque tracking along the left anterior descending coronary artery.

Mediastinum/Nodes: Unremarkable CT appearance of the thyroid gland.
No suspicious mediastinal or hilar adenopathy. No soft tissue
mediastinal mass. Small hiatal hernia.

Lungs/Pleura: Mild dependent atelectasis. Nonspecific 0.9 x 0.7 cm
semi solid nodule in the right lung apex (image 30 series 6).
Approximately 1 cm nodular opacity in the periphery of the right
upper lobe (image 74 series 6) with adjacent tree-in-bud micro
nodularity likely represents inspissated secretions within
bronchials. The lungs are otherwise clear.

Upper Abdomen: Punctate nonobstructing stone in the interpolar right
renal collecting system. Gallbladder is surgically absent. No acute
abnormality within the upper abdomen.

Musculoskeletal: No acute fracture or aggressive appearing lytic or
blastic osseous lesion.

Review of the MIP images confirms the above findings.
IMPRESSION: 1. Negative for acute pulmonary embolus, pneumonia or other acute
cardiopulmonary process.
2. Nonspecific 0.9 cm semi solid nodule in the right lung apex.
Initial follow-up with CT at 6-12 months is recommended to confirm
persistence. If persistent, repeat CT is recommended every 2 years
until 5 years of stability has been established. This recommendation
follows the consensus statement: Guidelines for Management of
Incidental Pulmonary Nodules Detected on CT Images:From the
[HOSPITAL] 9146; published online before print
(10.1148/radiol.3702020262).
3. Cluster of nodular opacities in the periphery of the right upper
lobe likely represents sequelae of a prior infectious/inflammatory
process or an indolent atypical infectious/inflammatory process such
as MOATSHE.
4. Coronary artery calcifications.
5. Small hiatal hernia.
6. Nonobstructing right-sided nephrolithiasis.

## 2022-11-24 DIAGNOSIS — L245 Irritant contact dermatitis due to other chemical products: Secondary | ICD-10-CM | POA: Diagnosis not present

## 2022-11-24 DIAGNOSIS — D225 Melanocytic nevi of trunk: Secondary | ICD-10-CM | POA: Diagnosis not present

## 2022-11-24 DIAGNOSIS — Z85828 Personal history of other malignant neoplasm of skin: Secondary | ICD-10-CM | POA: Diagnosis not present

## 2022-11-24 DIAGNOSIS — L570 Actinic keratosis: Secondary | ICD-10-CM | POA: Diagnosis not present

## 2022-11-24 DIAGNOSIS — L821 Other seborrheic keratosis: Secondary | ICD-10-CM | POA: Diagnosis not present

## 2022-11-24 DIAGNOSIS — D485 Neoplasm of uncertain behavior of skin: Secondary | ICD-10-CM | POA: Diagnosis not present

## 2023-01-12 ENCOUNTER — Encounter: Payer: Self-pay | Admitting: Physician Assistant

## 2023-01-12 ENCOUNTER — Telehealth: Payer: Medicare Other | Admitting: Physician Assistant

## 2023-01-12 VITALS — Ht 66.0 in | Wt 140.0 lb

## 2023-01-12 DIAGNOSIS — U071 COVID-19: Secondary | ICD-10-CM

## 2023-01-12 NOTE — Progress Notes (Signed)
Virtual Visit via Video   I connected with [place] on 01/12/23 at 11:20 AM EDT by a video enabled telemedicine application and verified that I am speaking with the correct person using two identifiers. Location patient: Home Location provider: [facility], Office Persons participating in the virtual visit: [place], [place] PA-C, Corky Mull, LPN   I discussed the limitations of evaluation and management by telemedicine and the availability of in person appointments. The patient expressed understanding and agreed to proceed.  I acted as a Neurosurgeon for [employer], PA-C [employer], LPN   Subjective:   HPI:   COVID-19 Positive Symptom onset: Monday 8/19 Travel/contacts: Husband positive on Friday 8/16 Vaccination status: 3 vaccines Testing results: Home test positive yesterday 8/19  Patient endorses the following symptoms: Headache, nasal congestion white drainage, body aches  Has never had COVID in the past Denies history of asthma/copd or other underlying lung issues  Patient denies the following symptoms: Denies chest pain, SOB  Treatments tried: Aleve, Alka-seltzer Cold & Flu last night.  ROS: See pertinent positives and negatives per HPI.  Patient Active Problem List   Diagnosis Date Noted   Mixed hyperlipidemia 06/19/2021   GERD (gastroesophageal reflux disease) 06/19/2021   History of postmenopausal HRT 06/19/2021   Acquired hypothyroidism 06/02/2018   GAD (generalized anxiety disorder) 06/02/2018   Panic attack 06/02/2018   Osteoporosis 06/02/2018    Social History   Tobacco Use   Smoking status: Never   Smokeless tobacco: Never  Substance Use Topics   Alcohol use: Yes    Current Outpatient Medications:    alendronate (FOSAMAX) 70 MG tablet, Take 1 tablet (70 mg total) by mouth every 7 (seven) days. Take with a full glass of water on an empty stomach., Disp: 12 tablet, Rfl: 3   atorvastatin (LIPITOR) 10 MG tablet,  TAKE 1 TABLET(10 MG) BY MOUTH AT BEDTIME, Disp: 90 tablet, Rfl: 3   levothyroxine (SYNTHROID) 75 MCG tablet, TAKE 1 TABLET(75 MCG) BY MOUTH DAILY, Disp: 90 tablet, Rfl: 3   omeprazole (PRILOSEC) 20 MG capsule, Take 1 capsule (20 mg total) by mouth daily., Disp: 90 capsule, Rfl: 3   sertraline (ZOLOFT) 50 MG tablet, Take 1 tablet (50 mg total) by mouth daily., Disp: 90 tablet, Rfl: 3  Allergies  Allergen Reactions   Lobster  [Shellfish Allergy] Diarrhea and Nausea And Vomiting    Objective:   VITALS: Per patient if applicable, see vitals. GENERAL: Alert, appears well and in no acute distress. HEENT: Atraumatic, conjunctiva clear, no obvious abnormalities on inspection of external nose and ears. NECK: Normal movements of the head and neck. CARDIOPULMONARY: No increased WOB. Speaking in clear sentences. I:E ratio WNL.  MS: Moves all visible extremities without noticeable abnormality. PSYCH: Pleasant and cooperative, well-groomed. Speech normal rate and rhythm. Affect is appropriate. Insight and judgement are appropriate. Attention is focused, linear, and appropriate.  NEURO: CN grossly intact. Oriented as arrived to appointment on time with no prompting. Moves both UE equally.  SKIN: No obvious lesions, wounds, erythema, or cyanosis noted on face or hands.  Assessment and Plan:   Palmina was seen today for covid positive.  Diagnoses and all orders for this visit:  COVID-19    No red flags on discussion, patient is not in any obvious distress during our visit. Discussed progression of most viral illnesses, and recommended supportive care at this point in time.  We did discuss possible use of Paxlovid however after discussing risks/benefits, she is  not interested in this at this time  Discussed that if we were to start Paxlovid we would have her hold statin during treatment and resume 1 week afterwards to prevent medication interaction  Discussed over the counter supportive care  options, including Tylenol 500 mg q 8 hours, with recommendations to push fluids and rest. Reviewed return precautions including new/worsening fever, SOB, new/worsening cough, sudden onset changes of symptoms. Recommended need to self-quarantine and practice social distancing until symptoms resolve. I recommend that patient follow-up if symptoms worsen or persist despite treatment x 7-10 days, sooner if needed.  I discussed the assessment and treatment plan with the patient. The patient was provided an opportunity to ask questions and all were answered. The patient agreed with the plan and demonstrated an understanding of the instructions.   The patient was advised to call back or seek an in-person evaluation if the symptoms worsen or if the condition fails to improve as anticipated.  CMA or LPN served as scribe during this visit. History, Physical, and Plan performed by medical provider. The above documentation has been reviewed and is accurate and complete.   [place], [place] 01/12/2023

## 2023-02-16 DIAGNOSIS — I10 Essential (primary) hypertension: Secondary | ICD-10-CM | POA: Diagnosis not present

## 2023-02-16 DIAGNOSIS — R002 Palpitations: Secondary | ICD-10-CM | POA: Diagnosis not present

## 2023-02-16 DIAGNOSIS — I160 Hypertensive urgency: Secondary | ICD-10-CM | POA: Diagnosis not present

## 2023-02-16 DIAGNOSIS — E785 Hyperlipidemia, unspecified: Secondary | ICD-10-CM | POA: Diagnosis not present

## 2023-02-16 DIAGNOSIS — I491 Atrial premature depolarization: Secondary | ICD-10-CM | POA: Diagnosis not present

## 2023-02-16 DIAGNOSIS — E039 Hypothyroidism, unspecified: Secondary | ICD-10-CM | POA: Diagnosis not present

## 2023-02-21 ENCOUNTER — Other Ambulatory Visit: Payer: Self-pay

## 2023-02-21 ENCOUNTER — Emergency Department (HOSPITAL_BASED_OUTPATIENT_CLINIC_OR_DEPARTMENT_OTHER)
Admission: EM | Admit: 2023-02-21 | Discharge: 2023-02-21 | Disposition: A | Discharge status: Home / Self Care | Payer: Medicare Other | Attending: Emergency Medicine | Admitting: Emergency Medicine

## 2023-02-21 ENCOUNTER — Encounter (HOSPITAL_BASED_OUTPATIENT_CLINIC_OR_DEPARTMENT_OTHER): Payer: Self-pay | Admitting: Emergency Medicine

## 2023-02-21 DIAGNOSIS — I10 Essential (primary) hypertension: Secondary | ICD-10-CM | POA: Diagnosis not present

## 2023-02-21 DIAGNOSIS — R002 Palpitations: Secondary | ICD-10-CM | POA: Insufficient documentation

## 2023-02-21 DIAGNOSIS — R42 Dizziness and giddiness: Secondary | ICD-10-CM | POA: Insufficient documentation

## 2023-02-21 LAB — URINALYSIS, ROUTINE W REFLEX MICROSCOPIC
Bilirubin Urine: NEGATIVE
Glucose, UA: NEGATIVE mg/dL
Hgb urine dipstick: NEGATIVE
Ketones, ur: NEGATIVE mg/dL
Nitrite: NEGATIVE
Protein, ur: NEGATIVE mg/dL
Specific Gravity, Urine: 1.01 (ref 1.005–1.030)
pH: 5 (ref 5.0–8.0)

## 2023-02-21 LAB — CBC
HCT: 50.4 % — ABNORMAL HIGH (ref 36.0–46.0)
Hemoglobin: 17 g/dL — ABNORMAL HIGH (ref 12.0–15.0)
MCH: 29.1 pg (ref 26.0–34.0)
MCHC: 33.7 g/dL (ref 30.0–36.0)
MCV: 86.3 fL (ref 80.0–100.0)
Platelets: 315 10*3/uL (ref 150–400)
RBC: 5.84 MIL/uL — ABNORMAL HIGH (ref 3.87–5.11)
RDW: 12.9 % (ref 11.5–15.5)
WBC: 8.7 10*3/uL (ref 4.0–10.5)
nRBC: 0 % (ref 0.0–0.2)

## 2023-02-21 LAB — TROPONIN I (HIGH SENSITIVITY)
Troponin I (High Sensitivity): 4 ng/L (ref <18)
Troponin I (High Sensitivity): 5 ng/L (ref <18)

## 2023-02-21 LAB — BASIC METABOLIC PANEL
Anion gap: 10 (ref 5–15)
BUN: 19 mg/dL (ref 8–23)
CO2: 28 mmol/L (ref 22–32)
Calcium: 10.2 mg/dL (ref 8.9–10.3)
Chloride: 99 mmol/L (ref 98–111)
Creatinine, Ser: 1.03 mg/dL — ABNORMAL HIGH (ref 0.44–1.00)
GFR, Estimated: 55 mL/min — ABNORMAL LOW (ref >60)
Glucose, Bld: 123 mg/dL — ABNORMAL HIGH (ref 70–99)
Potassium: 4.7 mmol/L (ref 3.5–5.1)
Sodium: 137 mmol/L (ref 135–145)

## 2023-02-21 MED ORDER — LACTATED RINGERS IV BOLUS
500.0000 mL | Freq: Once | INTRAVENOUS | Status: AC
  Administered 2023-02-21: 500 mL via INTRAVENOUS

## 2023-02-21 NOTE — Discharge Instructions (Signed)
You were seen in the emerged department for palpitations and low blood pressure Your symptoms may be due to dehydration or the new medication (HCTZ) You should hold this new medication until you talk to your primary doctor Return to the emerged part for chest pain, trouble breathing or any other concerns

## 2023-02-21 NOTE — ED Provider Notes (Signed)
  Physical Exam  BP 131/65   Pulse (!) 55   Temp 98 F (36.7 C) (Oral)   Resp 16   SpO2 97%   Physical Exam Vitals and nursing note reviewed.  HENT:     Head: Normocephalic and atraumatic.  Cardiovascular:     Rate and Rhythm: Normal rate and regular rhythm.  Pulmonary:     Effort: Pulmonary effort is normal.  Abdominal:     General: There is no distension.  Neurological:     Mental Status: She is alert.  Psychiatric:        Mood and Affect: Mood normal.     Procedures  Procedures  ED Course / MDM   Clinical Course as of 02/21/23 1916  Sun Feb 21, 2023  1916 Delta troponin flat.  No active chest pain.  Patient has received IV fluids.  She reports feeling well.  Systolic blood pressure now in the 140s.  She will hold hydrochlorothiazide until she talks to her primary care doctor and will call the office tomorrow.  Return precautions discussed with the patient and her husband in detail. [MP]    Clinical Course User Index [MP] Royanne Foots, DO   Medical Decision Making Amount and/or Complexity of Data Reviewed Labs: ordered.   Jackquline Bosch DO, have assumed care of this patient from the previous provider pending delta troponin, reevaluation after IV fluids and disposition.  Final diagnosis Palpitations         Royanne Foots, DO 02/21/23 1918

## 2023-02-21 NOTE — ED Triage Notes (Signed)
[place] 199/90s Seen at ed and started on htcz Now BP low 98/74, 102/70 Feels shaky

## 2023-02-21 NOTE — ED Provider Notes (Signed)
[facility] AT [facility] Provider Note   CSN: [id number] Arrival date & time: 02/21/23  1144     History {Add pertinent medical, surgical, social history, OB history to HPI:1} Chief Complaint  Patient presents with   Dizziness    Patricia Patrick is a 82 y.o. female.  HPI 82 yo female seen in ED [place] for hypertension on 02/16/23 .  She states she was flushed and shakey.  BP 190- Patient started hztz 25 mg and has taken two day- Friday and yesterday.  Patient had light headedness and shaky today and became diaphoretic.  Took bp and sbp 98, then 102    Home Medications Prior to Admission medications   Medication Sig Start Date End Date Taking? Authorizing Provider  alendronate (FOSAMAX) 70 MG tablet Take 1 tablet (70 mg total) by mouth every 7 (seven) days. Take with a full glass of water on an empty stomach. 06/22/22   Willow Ora, MD  atorvastatin (LIPITOR) 10 MG tablet TAKE 1 TABLET(10 MG) BY MOUTH AT BEDTIME 08/07/22   Willow Ora, MD  levothyroxine (SYNTHROID) 75 MCG tablet TAKE 1 TABLET(75 MCG) BY MOUTH DAILY 08/05/22   Willow Ora, MD  omeprazole (PRILOSEC) 20 MG capsule Take 1 capsule (20 mg total) by mouth daily. 06/22/22   Willow Ora, MD  sertraline (ZOLOFT) 50 MG tablet Take 1 tablet (50 mg total) by mouth daily. 06/22/22   Willow Ora, MD      Allergies    Josefina Do allergy]    Review of Systems   Review of Systems  Physical Exam Updated Vital Signs BP 127/80 (BP Location: Right Arm)   Pulse 71   Temp 97.6 F (36.4 C)   Resp 18   SpO2 100%  Physical Exam Vitals and nursing note reviewed.  Constitutional:      General: She is not in acute distress.    Appearance: She is well-developed.  HENT:     Head: Normocephalic and atraumatic.     Right Ear: External ear normal.     Left Ear: External ear normal.     Nose: Nose normal.  Eyes:     Conjunctiva/sclera: Conjunctivae normal.     Pupils:  Pupils are equal, round, and reactive to light.  Cardiovascular:     Rate and Rhythm: Normal rate and regular rhythm.  Pulmonary:     Effort: Pulmonary effort is normal.  Abdominal:     General: Abdomen is flat.     Palpations: Abdomen is soft.  Musculoskeletal:        General: Normal range of motion.     Cervical back: Normal range of motion and neck supple.  Skin:    General: Skin is warm and dry.     Capillary Refill: Capillary refill takes less than 2 seconds.  Neurological:     Mental Status: She is alert and oriented to person, place, and time.     Motor: No abnormal muscle tone.     Coordination: Coordination normal.  Psychiatric:        Behavior: Behavior normal.        Thought Content: Thought content normal.     ED Results / Procedures / Treatments   Labs (all labs ordered are listed, but only abnormal results are displayed) Labs Reviewed  BASIC METABOLIC PANEL - Abnormal; Notable for the following components:      Result Value   Glucose, Bld 123 (*)  Creatinine, Ser 1.03 (*)    GFR, Estimated 55 (*)    All other components within normal limits  CBC - Abnormal; Notable for the following components:   RBC 5.84 (*)    Hemoglobin 17.0 (*)    HCT 50.4 (*)    All other components within normal limits  URINALYSIS, ROUTINE W REFLEX MICROSCOPIC - Abnormal; Notable for the following components:   Leukocytes,Ua TRACE (*)    Bacteria, UA RARE (*)    All other components within normal limits    EKG EKG Interpretation Date/Time:  Sunday February 21 2023 12:48:00 EDT Ventricular Rate:  85 PR Interval:  172 QRS Duration:  84 QT Interval:  344 QTC Calculation: 409 R Axis:   82  Text Interpretation: Normal sinus rhythm Nonspecific ST abnormality Abnormal ECG When compared with ECG of 17-Oct-2020 12:04, PREVIOUS ECG IS PRESENT Confirmed by Margarita Grizzle [phone]) on 02/21/2023 12:55:37 PM  Radiology No results found.  Procedures Procedures  {Document cardiac  monitor, telemetry assessment procedure when appropriate:1}  Medications Ordered in ED Medications - No data to display  ED Course/ Medical Decision Making/ A&P   {   Click here for ABCD2, HEART and other calculatorsREFRESH Note before signing :1}                              Medical Decision Making Amount and/or Complexity of Data Reviewed Labs: ordered.   82 year old female who was seen last week in [place] for some flushing and high blood pressure.  She was started on hydrochlorothiazide.  She has not previously had high blood pressure.  Today she felt generally weak, shaky, and lightheaded.  Blood pressures were in the 90s. Patient was evaluated here with EKG with nonspecific ST changes CBC obtained and hemoglobin is elevated at 17 Basic metabolic panel significant for creatinine slightly increased at 1.03 from patient's baseline of 0.88 Troponin was evaluated and is normal 82 year old female with weakness and shakiness with broad differential which includes but is not limited to hypotension, hypotension secondary to volume depletion from hydrochlorothiazide, anemia, electrolyte abnormality, cardiac abnormality/ischemia, PE, infection Based on patient's labs doubt acute infection.  She has remained stable   {Document critical care time when appropriate:1} {Document review of labs and clinical decision tools ie heart score, Chads2Vasc2 etc:1}  {Document your independent review of radiology images, and any outside records:1} {Document your discussion with family members, caretakers, and with consultants:1} {Document social determinants of health affecting pt's care:1} {Document your decision making why or why not admission, treatments were needed:1} Final Clinical Impression(s) / ED Diagnoses Final diagnoses:  None    Rx / DC Orders ED Discharge Orders     None

## 2023-02-24 ENCOUNTER — Encounter: Payer: Self-pay | Admitting: Family Medicine

## 2023-02-24 ENCOUNTER — Ambulatory Visit (INDEPENDENT_AMBULATORY_CARE_PROVIDER_SITE_OTHER): Payer: Medicare Other | Admitting: Family Medicine

## 2023-02-24 VITALS — BP 112/74 | HR 61 | Temp 97.8°F | Ht 66.0 in | Wt 143.8 lb

## 2023-02-24 DIAGNOSIS — R002 Palpitations: Secondary | ICD-10-CM | POA: Diagnosis not present

## 2023-02-24 DIAGNOSIS — Z23 Encounter for immunization: Secondary | ICD-10-CM | POA: Diagnosis not present

## 2023-02-24 DIAGNOSIS — R030 Elevated blood-pressure reading, without diagnosis of hypertension: Secondary | ICD-10-CM | POA: Diagnosis not present

## 2023-02-24 DIAGNOSIS — R252 Cramp and spasm: Secondary | ICD-10-CM

## 2023-02-24 DIAGNOSIS — R251 Tremor, unspecified: Secondary | ICD-10-CM

## 2023-02-24 LAB — CBC WITH DIFFERENTIAL/PLATELET
Basophils Absolute: 0 10*3/uL (ref 0.0–0.1)
Basophils Relative: 0.7 % (ref 0.0–3.0)
Eosinophils Absolute: 0.1 10*3/uL (ref 0.0–0.7)
Eosinophils Relative: 1 % (ref 0.0–5.0)
HCT: 42.9 % (ref 36.0–46.0)
Hemoglobin: 14.1 g/dL (ref 12.0–15.0)
Lymphocytes Relative: 24 % (ref 12.0–46.0)
Lymphs Abs: 1.4 10*3/uL (ref 0.7–4.0)
MCHC: 32.9 g/dL (ref 30.0–36.0)
MCV: 87.4 fL (ref 78.0–100.0)
Monocytes Absolute: 0.3 10*3/uL (ref 0.1–1.0)
Monocytes Relative: 4.7 % (ref 3.0–12.0)
Neutro Abs: 4 10*3/uL (ref 1.4–7.7)
Neutrophils Relative %: 69.6 % (ref 43.0–77.0)
Platelets: 242 10*3/uL (ref 150.0–400.0)
RBC: 4.9 Mil/uL (ref 3.87–5.11)
RDW: 13.5 % (ref 11.5–15.5)
WBC: 5.7 10*3/uL (ref 4.0–10.5)

## 2023-02-24 LAB — VITAMIN D 25 HYDROXY (VIT D DEFICIENCY, FRACTURES): VITD: 82.59 ng/mL (ref 30.00–100.00)

## 2023-02-24 LAB — BASIC METABOLIC PANEL
BUN: 11 mg/dL (ref 6–23)
CO2: 27 meq/L (ref 19–32)
Calcium: 9.4 mg/dL (ref 8.4–10.5)
Chloride: 99 meq/L (ref 96–112)
Creatinine, Ser: 0.83 mg/dL (ref 0.40–1.20)
GFR: 66.18 mL/min (ref >60.00)
Glucose, Bld: 95 mg/dL (ref 70–99)
Potassium: 4 meq/L (ref 3.5–5.1)
Sodium: 134 meq/L — ABNORMAL LOW (ref 135–145)

## 2023-02-24 LAB — TSH: TSH: 1.05 u[IU]/mL (ref 0.35–5.50)

## 2023-02-24 LAB — MAGNESIUM: Magnesium: 2.1 mg/dL (ref 1.5–2.5)

## 2023-02-24 NOTE — Progress Notes (Signed)
Subjective  CC:  Chief Complaint  Patient presents with   Dehydration    Pt [place] Pt would like to discuss treatment. Pt aware mammo due, declines at this time     HPI: Patricia Patrick is a 82 y.o. female who presents to the office today to address the problems listed above in the chief complaint. 82 year old female without history of hypertension but had a borderline reading last January presents for urgent care and ED follow-up.  [place], lots of sightseeing, high altitude, little fluid intake when she felt shaky and lightheaded.  Urgent care eval show blood pressure elevation and changes in EKG.  Follow-up ER visit showed elevated blood pressure, PACs on her EKG.  She was treated with IV fluids.  She was started on hydrochlorothiazide 25 mg daily.  She took this while she was traveling and then became lightheaded and near syncopal.  A follow-up ER visit showed hypotension.  Ischemic workup was negative with normal troponins and normal follow-up EKG.  Electrolytes were okay.  She was mildly dehydrated.  Again treated with IV fluids.  Since being home for the last 4 days she is starting to improve.  Drinking plenty of fluids.  Still feels a little shaky.  No further headaches or lightheadedness.  Sleeping well.  She is having some leg cramps.  Assessment  1. Palpitations   2. Elevated blood pressure reading without diagnosis of hypertension   3. Leg cramps   4. Tremor   5. Need for influenza vaccination      Plan  Symptom complex most consistent with altitude sickness: Reassured, continue good hydration, stop HCTZ.  Check home blood pressures.  Will check electrolytes, blood count and TSH.  Check vitamin D and magnesium given leg cramps.  Start tonic water as needed.  Will recheck again in 6 to 8 weeks to ensure blood pressure is controlled.  Patient agrees with care plan.  Follow up: 6 to 8  weeks 07/27/2023  Orders Placed This Encounter  Procedures   Flu Vaccine Trivalent High Dose (Fluad)   CBC with Differential/Platelet   TSH   Basic metabolic panel   Magnesium   VITAMIN D 25 Hydroxy (Vit-D Deficiency, Fractures)   No orders of the defined types were placed in this encounter.     I reviewed the patients updated PMH, FH, and SocHx.    Patient Active Problem List   Diagnosis Date Noted   GERD (gastroesophageal reflux disease) 06/19/2021    Priority: Medium    Mixed hyperlipidemia 06/19/2021   History of postmenopausal HRT 06/19/2021   Acquired hypothyroidism 06/02/2018   GAD (generalized anxiety disorder) 06/02/2018   Panic attack 06/02/2018   Osteoporosis 06/02/2018   Current Meds  Medication Sig   atorvastatin (LIPITOR) 10 MG tablet TAKE 1 TABLET(10 MG) BY MOUTH AT BEDTIME   levothyroxine (SYNTHROID) 75 MCG tablet TAKE 1 TABLET(75 MCG) BY MOUTH DAILY   omeprazole (PRILOSEC) 20 MG capsule Take 1 capsule (20 mg total) by mouth daily.   sertraline (ZOLOFT) 50 MG tablet Take 1 tablet (50 mg total) by mouth daily.    Allergies: Patient is allergic to fosamax [alendronate] and lobster  [shellfish allergy]. Family History: Patient family history includes Cancer in her sister; Healthy in her son and son; Heart attack in her father; Obesity in her mother. Social History:  Patient  reports that she has never smoked. She has never used smokeless  tobacco. She reports current alcohol use. She reports that she does not use drugs.  Review of Systems: Constitutional: Negative for fever malaise or anorexia Cardiovascular: negative for chest pain Respiratory: negative for SOB or persistent cough Gastrointestinal: negative for abdominal pain  Objective  Vitals: BP 112/74   Pulse 61   Temp 97.8 F (36.6 C)   Ht 5\' 6"  (1.676 m)   Wt 143 lb 12.8 oz (65.2 kg)   SpO2 98%   BMI 23.21 kg/m  General: no acute distress , A&Ox3 HEENT: PEERL, conjunctiva normal, neck is  supple Cardiovascular:  RRR without murmur or gallop.  Respiratory:  Good breath sounds bilaterally, CTAB with normal respiratory effort Extremities without edema Neuro: Fine minimal tremor present Skin:  Warm, no rashes  Commons side effects, risks, benefits, and alternatives for medications and treatment plan prescribed today were discussed, and the patient expressed understanding of the given instructions. Patient is instructed to call or message via MyChart if he/she has any questions or concerns regarding our treatment plan. No barriers to understanding were identified. We discussed Red Flag symptoms and signs in detail. Patient expressed understanding regarding what to do in case of urgent or emergency type symptoms.  Medication list was reconciled, printed and provided to the patient in AVS. Patient instructions and summary information was reviewed with the patient as documented in the AVS. This note was prepared with assistance of Dragon voice recognition software. Occasional wrong-word or sound-a-like substitutions may have occurred due to the inherent limitations of voice recognition software

## 2023-02-28 NOTE — Progress Notes (Signed)
See my chart note.

## 2023-03-29 ENCOUNTER — Telehealth: Payer: Self-pay

## 2023-03-29 NOTE — Telephone Encounter (Signed)
Transition Care Management Unsuccessful Follow-up Telephone Call  Date of discharge and from where:  02/21/2023 [facility]  Attempts:  1st Attempt  Reason for unsuccessful TCM follow-up call:  Left voice message  [facility]  [facility], [facility] Guide Direct Dial: [phone]  Website: [email or web address]

## 2023-03-30 ENCOUNTER — Telehealth: Payer: Self-pay

## 2023-03-30 NOTE — Telephone Encounter (Signed)
Transition Care Management Unsuccessful Follow-up Telephone Call  Date of discharge and from where:  02/21/2023 [facility]  Attempts:  2nd Attempt  Reason for unsuccessful TCM follow-up call:  Left voice message  Onisha Cedeno [facility]  [facility], [facility] Guide Direct Dial: [phone]  Website: [email or web address]

## 2023-04-26 ENCOUNTER — Other Ambulatory Visit: Payer: Self-pay

## 2023-04-26 ENCOUNTER — Telehealth: Payer: Self-pay | Admitting: Family Medicine

## 2023-04-26 MED ORDER — ATORVASTATIN CALCIUM 10 MG PO TABS: 10.0000 mg | ORAL_TABLET | Freq: Every evening | ORAL | 3 refills | Status: DC

## 2023-04-26 NOTE — Telephone Encounter (Signed)
Prescription Request  04/26/2023  LOV: 02/24/2023  What is the name of the medication or equipment? atorvastatin (LIPITOR) 10 MG tablet   Have you contacted your pharmacy to request a refill? Yes   Which pharmacy would you like this sent to?  [id number] - [place], [place] - [address] [place] [address] Phone: [phone] Fax: [phone]    Patient notified that their request is being sent to the clinical staff for review and that they should receive a response within 2 business days.   Please advise at Mobile [phone] (mobile)

## 2023-04-26 NOTE — Telephone Encounter (Signed)
Rx sent 

## 2023-06-25 ENCOUNTER — Ambulatory Visit (INDEPENDENT_AMBULATORY_CARE_PROVIDER_SITE_OTHER): Payer: Medicare Other | Admitting: Family Medicine

## 2023-06-25 ENCOUNTER — Encounter: Payer: Self-pay | Admitting: Family Medicine

## 2023-06-25 VITALS — BP 140/76 | HR 60 | Temp 97.2°F | Ht 66.0 in | Wt 147.6 lb

## 2023-06-25 DIAGNOSIS — K219 Gastro-esophageal reflux disease without esophagitis: Secondary | ICD-10-CM

## 2023-06-25 DIAGNOSIS — Z0001 Encounter for general adult medical examination with abnormal findings: Secondary | ICD-10-CM

## 2023-06-25 DIAGNOSIS — H02402 Unspecified ptosis of left eyelid: Secondary | ICD-10-CM

## 2023-06-25 DIAGNOSIS — Z79899 Other long term (current) drug therapy: Secondary | ICD-10-CM

## 2023-06-25 DIAGNOSIS — F411 Generalized anxiety disorder: Secondary | ICD-10-CM

## 2023-06-25 DIAGNOSIS — E039 Hypothyroidism, unspecified: Secondary | ICD-10-CM

## 2023-06-25 DIAGNOSIS — E559 Vitamin D deficiency, unspecified: Secondary | ICD-10-CM | POA: Diagnosis not present

## 2023-06-25 DIAGNOSIS — M816 Localized osteoporosis [Lequesne]: Secondary | ICD-10-CM

## 2023-06-25 DIAGNOSIS — E782 Mixed hyperlipidemia: Secondary | ICD-10-CM | POA: Diagnosis not present

## 2023-06-25 DIAGNOSIS — R030 Elevated blood-pressure reading, without diagnosis of hypertension: Secondary | ICD-10-CM | POA: Diagnosis not present

## 2023-06-25 LAB — LIPID PANEL
Cholesterol: 187 mg/dL (ref 0–200)
HDL: 90.1 mg/dL (ref >39.00)
LDL Cholesterol: 79 mg/dL (ref 0–99)
NonHDL: 96.51
Total CHOL/HDL Ratio: 2
Triglycerides: 90 mg/dL (ref 0.0–149.0)
VLDL: 18 mg/dL (ref 0.0–40.0)

## 2023-06-25 LAB — COMPREHENSIVE METABOLIC PANEL
ALT: 18 U/L (ref 0–35)
AST: 21 U/L (ref 0–37)
Albumin: 4.2 g/dL (ref 3.5–5.2)
Alkaline Phosphatase: 141 U/L — ABNORMAL HIGH (ref 39–117)
BUN: 18 mg/dL (ref 6–23)
CO2: 31 meq/L (ref 19–32)
Calcium: 9.3 mg/dL (ref 8.4–10.5)
Chloride: 105 meq/L (ref 96–112)
Creatinine, Ser: 0.88 mg/dL (ref 0.40–1.20)
GFR: 61.55 mL/min (ref >60.00)
Glucose, Bld: 89 mg/dL (ref 70–99)
Potassium: 4.2 meq/L (ref 3.5–5.1)
Sodium: 144 meq/L (ref 135–145)
Total Bilirubin: 0.6 mg/dL (ref 0.2–1.2)
Total Protein: 6.1 g/dL (ref 6.0–8.3)

## 2023-06-25 LAB — VITAMIN D 25 HYDROXY (VIT D DEFICIENCY, FRACTURES): VITD: 82.35 ng/mL (ref 30.00–100.00)

## 2023-06-25 LAB — CBC WITH DIFFERENTIAL/PLATELET
Basophils Absolute: 0.1 10*3/uL (ref 0.0–0.1)
Basophils Relative: 2.1 % (ref 0.0–3.0)
Eosinophils Absolute: 0.1 10*3/uL (ref 0.0–0.7)
Eosinophils Relative: 2.2 % (ref 0.0–5.0)
HCT: 42.1 % (ref 36.0–46.0)
Hemoglobin: 14 g/dL (ref 12.0–15.0)
Lymphocytes Relative: 23.1 % (ref 12.0–46.0)
Lymphs Abs: 1.3 10*3/uL (ref 0.7–4.0)
MCHC: 33.4 g/dL (ref 30.0–36.0)
MCV: 87 fL (ref 78.0–100.0)
Monocytes Absolute: 0.2 10*3/uL (ref 0.1–1.0)
Monocytes Relative: 4.1 % (ref 3.0–12.0)
Neutro Abs: 3.7 10*3/uL (ref 1.4–7.7)
Neutrophils Relative %: 68.5 % (ref 43.0–77.0)
Platelets: 232 10*3/uL (ref 150.0–400.0)
RBC: 4.84 Mil/uL (ref 3.87–5.11)
RDW: 13.4 % (ref 11.5–15.5)
WBC: 5.5 10*3/uL (ref 4.0–10.5)

## 2023-06-25 LAB — TSH: TSH: 0.6 u[IU]/mL (ref 0.35–5.50)

## 2023-06-25 LAB — VITAMIN B12: Vitamin B-12: 152 pg/mL — ABNORMAL LOW (ref 211–911)

## 2023-06-25 MED ORDER — SERTRALINE HCL 50 MG PO TABS: 50.0000 mg | ORAL_TABLET | Freq: Every day | ORAL | 3 refills | Status: DC

## 2023-06-25 NOTE — Patient Instructions (Addendum)
Please return in 12 months for your annual complete physical; please come fasting.   I will release your lab results to you on your MyChart account with further instructions. You may see the results before I do, but when I review them I will send you a message with my report or have my assistant call you if things need to be discussed. Please reply to my message with any questions. Thank you!   If you have any questions or concerns, please don't hesitate to send me a message via MyChart or call the office at [phone]. Thank you for visiting with [place] today! It's our pleasure caring for you.   VISIT SUMMARY:  During today's visit, we discussed your concerns about your eyes, your history of osteoporosis treatment side effects, and your blood pressure. We also reviewed your general health maintenance and family history.  YOUR PLAN:  -OSTEOPOROSIS: Osteoporosis is a condition where bones become weak and brittle. You experienced side effects with Fosamax, so we discussed starting Prolia, an injectable medication that can help build bone and stabilize your condition. We will begin the process for Prolia injections, pending insurance approval.  -EYE CONCERNS: You have noticed that one eye appears smaller than the other and experience a 'scraping' sensation, likely due to dryness. You have an upcoming appointment with an eye doctor, and I recommend discussing these symptoms with them during your visit. Ptosis can be caused by many things. Follow up with me if we need further evaluation.   -HYPERTENSION: Hypertension is high blood pressure. Your blood pressure was slightly elevated today, so we will continue to monitor it.  -GENERAL HEALTH MAINTENANCE: Continue taking your cholesterol medication as prescribed. We will discontinue annual mammograms as per your preference. Blood work has been ordered for today to check your thyroid function and other routine labs.  INSTRUCTIONS:  Please follow up with  the eye doctor as scheduled and discuss your symptoms with them. We will review the results of your blood work and the eye doctor's assessment during your next visit. Continue with your annual visits unless new issues arise.

## 2023-06-25 NOTE — Progress Notes (Signed)
Subjective  Chief Complaint  Patient presents with   Annual Exam    Pt here for annual exam - pt fasting today     Thyroid Problem    Pt states that her left eye is smaller for the past month and concerned about thyroid.    Anxiety    HPI: Patricia Patrick is a 83 y.o. female who presents to [facility] today for a Female Wellness Visit. She also has the concerns and/or needs as listed above in the chief complaint. These will be addressed in addition to the Health Maintenance Visit.   Wellness Visit: annual visit with health maintenance review and exam  HM: doing well overall. Defers further breast cancer screens. Healthy lifestyle. Imms are current. Chronic disease f/u and/or acute problem visit: (deemed necessary to be done in addition to the wellness visit): Left eyelid ptosis: Reports her children noticed that her left eye appears smaller.  She notices that at times she has to try to raise her eyebrows to keep her eyelids open.  Only notices problem on the left.  No other weakness or myopathy symptoms.  Otherwise feels well.  No visual changes.  No double vision.  Does not seem to be obstructing vision.  Has an appoint with an eye doctor scheduled Hypothyroidism with recent TSH checked in October and was normal.  No symptoms of low thyroid. Remains on chronic PPI for GERD which is well-controlled. Osteoporosis with history of fracture: Did not tolerate Fosamax due to mental cognitive fogginess.  Would be open to trying different medication. Hyperlipidemia on statin due for recheck.  Tolerates well  Assessment  1. Encounter for well adult exam with abnormal findings   2. Acquired hypothyroidism   3. Mixed hyperlipidemia   4. Localized osteoporosis without current pathological fracture   5. GAD (generalized anxiety disorder)   6. Gastroesophageal reflux disease without esophagitis   7. Acquired ptosis of left eyelid   8. Vitamin D deficiency   9.  Long-term current use of proton pump inhibitor therapy      Plan  Female Wellness Visit: Age appropriate Health Maintenance and Prevention measures were discussed with patient. Included topics are cancer screening recommendations, ways to keep healthy (see AVS) including dietary and exercise recommendations, regular eye and dental care, use of seat belts, and avoidance of moderate alcohol use and tobacco use.  BMI: discussed patient's BMI and encouraged positive lifestyle modifications to help get to or maintain a target BMI. HM needs and immunizations were addressed and ordered. See below for orders. See HM and immunization section for updates. Routine labs and screening tests ordered including cmp, cbc and lipids where appropriate. Discussed recommendations regarding Vit D and calcium supplementation (see AVS)  Chronic disease management visit and/or acute problem visit: Ptosis: Follow-up with eye doctor.  Suspect mechanical/age-related but can do further workup if needed. Recheck thyroid levels on levothyroxine daily.  Clinically euthyroid Recheck lipids and LFTs on statin.  Monitoring LFTs.  Has history of elevation Continue chronic PPI for GERD which is well-controlled, add B12 for B12 efficiency Osteoporosis: Education given.  Recommend Prolia.  Will try to get scheduled Prehypertension: Will continue to monitor, if persist, we will start blood pressure medication.  Follow up: 12 months for recheck Orders Placed This Encounter  Procedures   TSH   VITAMIN D 25 Hydroxy (Vit-D Deficiency, Fractures)   CBC with Differential/Platelet   Comprehensive metabolic panel   Lipid panel   Vitamin B12  Meds ordered this encounter  Medications   sertraline (ZOLOFT) 50 MG tablet    Sig: Take 1 tablet (50 mg total) by mouth daily.    Dispense:  90 tablet    Refill:  3      Body mass index is 23.82 kg/m. Wt Readings from Last 3 Encounters:  06/25/23 147 lb 9.6 oz (67 kg)  02/24/23 143  lb 12.8 oz (65.2 kg)  01/12/23 140 lb (63.5 kg)     Patient Active Problem List   Diagnosis Date Noted Date Diagnosed   GERD (gastroesophageal reflux disease) 06/19/2021     Priority: Medium    Mixed hyperlipidemia 06/19/2021    History of postmenopausal HRT 06/19/2021    Acquired hypothyroidism 06/02/2018    GAD (generalized anxiety disorder) 06/02/2018     Reports history of panic attacks and anxiety: Unclear reasons.  Started on Zoloft and is started well.  Started around 2012.  Has never tried coming off of the medication.  Currently well controlled.    Panic attack 06/02/2018    Osteoporosis 06/02/2018     DEXA 05/2016 lowest T = -1.8 10 FRAC score 1.8%, major frac score 9.9%; rec recheck 2 years and calcium and vit D. DEXA 08/2019 osteopenia lowest T=-1.4, FRAX score major 15%; stable, recheck 2 years.  DEXA 11/2021: Osteoporosis: Wrist T equals -2.6., started fosamax 05/2022 (Spine and femur: osteopenic range), didn't tolerate fosamax and stopped; rec Prolia. 2025    Health Maintenance  Topic Date Due   Medicare Annual Wellness (AWV)  07/21/2023   COVID-19 Vaccine (4 - 2024-25 season) 07/11/2023 (Originally 01/24/2023)   DEXA SCAN  11/25/2023   DTaP/Tdap/Td (2 - Td or Tdap) 10/21/2025   Pneumonia Vaccine 65+ Years old  Completed   INFLUENZA VACCINE  Completed   Zoster Vaccines- Shingrix  Completed   HPV VACCINES  Aged Out   Hepatitis C Screening  Discontinued   Immunization History  Administered Date(s) Administered   Fluad Quad(high Dose 65+) 02/14/2019, 02/11/2020, 02/12/2021   Fluad Trivalent(High Dose 65+) 02/24/2023   Influenza, High Dose Seasonal PF 02/21/2015, 03/23/2016   Influenza-Unspecified 02/21/2017, 04/08/2022   PFIZER(Purple Top)SARS-COV-2 Vaccination 06/16/2019, 07/07/2019, 02/11/2020   Pneumococcal Conjugate-13 10/22/2015   Pneumococcal Polysaccharide-23 03/24/2017   Tdap 10/22/2015   Zoster Recombinant(Shingrix) 06/02/2018, 08/01/2018   We updated and  reviewed the patient's past history in detail and it is documented below. Allergies: Patient is allergic to food, fosamax [alendronate], and lobster  [shellfish allergy]. Past Medical History Patient  has a past medical history of GAD (generalized anxiety disorder) (06/02/2018), Osteopenia (06/02/2018), and Thyroid disease. Past Surgical History Patient  has a past surgical history that includes Cholecystectomy (2009) and Abdominal hysterectomy (2009). Family History: Patient family history includes Cancer in her sister; Healthy in her son and son; Heart attack in her father; Obesity in her mother. Social History:  Patient  reports that she has never smoked. She has never used smokeless tobacco. She reports current alcohol use. She reports that she does not use drugs.  Review of Systems: Constitutional: negative for fever or malaise Ophthalmic: negative for photophobia, double vision or loss of vision Cardiovascular: negative for chest pain, dyspnea on exertion, or new LE swelling Respiratory: negative for SOB or persistent cough Gastrointestinal: negative for abdominal pain, change in bowel habits or melena Genitourinary: negative for dysuria or gross hematuria, no abnormal uterine bleeding or disharge Musculoskeletal: negative for new gait disturbance or muscular weakness Integumentary: negative for new or persistent rashes, no breast lumps  Neurological: negative for TIA or stroke symptoms Psychiatric: negative for SI or delusions Allergic/Immunologic: negative for hives  Patient Care Team    Relationship Specialty Notifications Start End  Willow Ora, MD PCP - General Family Medicine  06/02/18     Objective  Vitals: BP (!) 153/81   Pulse 60   Temp (!) 97.2 F (36.2 C)   Ht 5\' 6"  (1.676 m)   Wt 147 lb 9.6 oz (67 kg)   SpO2 (!) 36%   BMI 23.82 kg/m  General:  Well developed, well nourished, no acute distress  Psych:  Alert and orientedx3,normal mood and affect HEENT:   Normocephalic, atraumatic, non-icteric sclera,  EOMI, left lid lag is slightly present. supple neck without adenopathy, mass or thyromegaly Cardiovascular:  Normal S1, S2, RRR without gallop, rub or murmur Respiratory:  Good breath sounds bilaterally, CTAB with normal respiratory effort Gastrointestinal: normal bowel sounds, soft, non-tender, no noted masses. No HSM MSK: extremities without edema, joints without erythema or swelling Neurologic:    Mental status is normal.  Gross motor and sensory exams are normal.  No tremor  Commons side effects, risks, benefits, and alternatives for medications and treatment plan prescribed today were discussed, and the patient expressed understanding of the given instructions. Patient is instructed to call or message via MyChart if he/she has any questions or concerns regarding our treatment plan. No barriers to understanding were identified. We discussed Red Flag symptoms and signs in detail. Patient expressed understanding regarding what to do in case of urgent or emergency type symptoms.  Medication list was reconciled, printed and provided to the patient in AVS. Patient instructions and summary information was reviewed with the patient as documented in the AVS. This note was prepared with assistance of Dragon voice recognition software. Occasional wrong-word or sound-a-like substitutions may have occurred due to the inherent limitations of voice recognition software

## 2023-06-28 ENCOUNTER — Other Ambulatory Visit: Payer: Self-pay

## 2023-06-28 ENCOUNTER — Telehealth: Payer: Self-pay

## 2023-06-28 ENCOUNTER — Other Ambulatory Visit (HOSPITAL_COMMUNITY): Payer: Self-pay

## 2023-06-28 ENCOUNTER — Encounter: Payer: Self-pay | Admitting: Family Medicine

## 2023-06-28 DIAGNOSIS — E538 Deficiency of other specified B group vitamins: Secondary | ICD-10-CM | POA: Insufficient documentation

## 2023-06-28 DIAGNOSIS — M816 Localized osteoporosis [Lequesne]: Secondary | ICD-10-CM

## 2023-06-28 MED ORDER — DENOSUMAB 60 MG/ML ~~LOC~~ SOSY: 60.0000 mg | PREFILLED_SYRINGE | Freq: Once | SUBCUTANEOUS | Status: DC

## 2023-06-28 MED ORDER — DENOSUMAB 60 MG/ML ~~LOC~~ SOSY
60.0000 mg | PREFILLED_SYRINGE | Freq: Once | SUBCUTANEOUS | Status: AC
  Administered 2023-07-20: 60 mg via SUBCUTANEOUS

## 2023-06-28 NOTE — Telephone Encounter (Signed)
Prolia PA started , see previous Telephone Encounter.

## 2023-06-28 NOTE — Telephone Encounter (Signed)
 Prolia VOB initiated via [email or web address]

## 2023-06-28 NOTE — Progress Notes (Signed)
See mychart note Dear Patricia Patrick, Your lab results look good overall. Your thyroid levels are good. Kidney, liver and cholesterol levels all look good as well. Your Vitamin B12 levels are very low, so please start taking otc Vitamin B12 1000mg  daily. Please let me know what the eye doctor recommends after your visit.  Also, your blood pressure remains borderline high; please start monitoring this at home. IF your blood pressure is > 140/90 at home, please schedule a follow up visit with me to address this.  Thanks! Sincerely, Dr. Mardelle Matte

## 2023-06-28 NOTE — Telephone Encounter (Signed)
Please start PA for Prolia. Thank You!

## 2023-06-29 NOTE — Telephone Encounter (Signed)
 Prolia BIV in separate encounter.

## 2023-07-01 ENCOUNTER — Other Ambulatory Visit (HOSPITAL_COMMUNITY): Payer: Self-pay

## 2023-07-01 NOTE — Telephone Encounter (Signed)
 Patricia Patrick

## 2023-07-01 NOTE — Telephone Encounter (Signed)
 Prior Authorization form/request asks a question that requires your assistance. Please see the question below and advise accordingly. The PA will not be submitted until the necessary information is received.        Ran test claim for PROLIA . Currently a quantity of 1 ML is a 180 day supply and the co-pay is $555 .  This test claim was processed through [facility]- copay amounts may vary at other pharmacies due to pharmacy/plan contracts, or as the patient moves through the different stages of their insurance plan.

## 2023-07-06 DIAGNOSIS — R6889 Other general symptoms and signs: Secondary | ICD-10-CM | POA: Diagnosis not present

## 2023-07-08 DIAGNOSIS — Z961 Presence of intraocular lens: Secondary | ICD-10-CM | POA: Diagnosis not present

## 2023-07-09 NOTE — Telephone Encounter (Signed)
   Submitted PA stating intolerance to bisphosphates

## 2023-07-09 NOTE — Telephone Encounter (Signed)
Pt ready for scheduling for Prolia on or after : 07/09/23  Out-of-pocket cost due at time of visit: $357  Number of injection/visits approved: 2  Primary: UHC MEDICARE Prolia co-insurance: 20% Admin fee co-insurance: 20%  Secondary: --- Prolia co-insurance:  Admin fee co-insurance:   Medical Benefit Details: Date Benefits were checked: 06/30/23 Deductible: NO/ Coinsurance: 20%/ Admin Fee: 20%  Prior Auth: APPROVED PA# [id number] Expiration Date: 07/09/23-07/08/24  # of doses approved: 2  Pharmacy benefit: Copay $555 If patient wants fill through the pharmacy benefit please send prescription to: [facility], and include estimated need by date in rx notes. Pharmacy will ship medication directly to the office.  Patient NOT eligible for Prolia Copay Card. Copay Card can make patient's cost as little as $25. Link to apply: [email or web address]://[email or web address]  ** This summary of benefits is an estimation of the patient's out-of-pocket cost. Exact cost may very based on individual plan coverage.

## 2023-07-15 NOTE — Telephone Encounter (Signed)
 Noted

## 2023-07-20 ENCOUNTER — Ambulatory Visit (INDEPENDENT_AMBULATORY_CARE_PROVIDER_SITE_OTHER): Payer: Medicare Other

## 2023-07-20 DIAGNOSIS — M816 Localized osteoporosis [Lequesne]: Secondary | ICD-10-CM

## 2023-07-20 MED ORDER — DENOSUMAB 60 MG/ML ~~LOC~~ SOSY
60.0000 mg | PREFILLED_SYRINGE | Freq: Once | SUBCUTANEOUS | Status: AC
  Administered 2024-01-20: 60 mg via SUBCUTANEOUS

## 2023-07-20 NOTE — Progress Notes (Signed)
 Pt came in on the Nurse schedule to receive her Prolia injection. Administered in the back of the Left arm without any complaints. Pt has been advised to schedule her next prolia injection. Pt understood.   PK/CMA

## 2023-08-03 ENCOUNTER — Other Ambulatory Visit: Payer: Self-pay | Admitting: Family Medicine

## 2023-08-03 MED ORDER — OMEPRAZOLE 20 MG PO CPDR: 20.0000 mg | DELAYED_RELEASE_CAPSULE | Freq: Every day | ORAL | 3 refills | Status: DC

## 2023-08-03 MED ORDER — ATORVASTATIN CALCIUM 10 MG PO TABS: 10.0000 mg | ORAL_TABLET | Freq: Every evening | ORAL | 3 refills | Status: DC

## 2023-08-03 MED ORDER — LEVOTHYROXINE SODIUM 75 MCG PO TABS: 75.0000 ug | ORAL_TABLET | Freq: Once | ORAL | 3 refills | Status: AC

## 2023-08-03 NOTE — Telephone Encounter (Signed)
 Atorvastatin last filled 04/26/23 Levothyroxine last filled 08/05/22 Omeprazole last filled 06/22/22  Last visit 06/25/23 Next visit 08/25/23 AWV

## 2023-08-25 ENCOUNTER — Ambulatory Visit (INDEPENDENT_AMBULATORY_CARE_PROVIDER_SITE_OTHER): Payer: Medicare Other

## 2023-08-25 VITALS — Ht 67.2 in | Wt 147.0 lb

## 2023-08-25 DIAGNOSIS — Z0000 Encounter for general adult medical examination without abnormal findings: Secondary | ICD-10-CM

## 2023-08-25 NOTE — Progress Notes (Signed)
 Subjective:   Patricia Patrick is a 83 y.o. who presents for a Medicare Wellness preventive visit.  Visit Complete: Virtual I connected with  Patricia Patrick on 08/25/23 by a audio enabled telemedicine application and verified that I am speaking with the correct person using two identifiers.  Patient Location: Home  Provider Location: Home Office  I discussed the limitations of evaluation and management by telemedicine. The patient expressed understanding and agreed to proceed.  Vital Signs: Because this visit was a virtual/telehealth visit, some criteria may be missing or patient reported. Any vitals not documented were not able to be obtained and vitals that have been documented are patient reported.  VideoDeclined- This patient declined Librarian, academic. Therefore the visit was completed with audio only.  Persons Participating in Visit: Patient.  AWV Questionnaire: No: Patient Medicare AWV questionnaire was not completed prior to this visit.  Cardiac Risk Factors include: advanced age (>43men, >37 women);dyslipidemia     Objective:    Today's Vitals   08/25/23 1310  Weight: 147 lb (66.7 kg)  Height: 5' 7.2" (1.707 m)   Body mass index is 22.89 kg/m.     08/25/2023    1:14 PM 02/21/2023   12:34 PM 07/20/2022    9:13 AM 06/13/2021   11:22 AM 06/07/2020   11:09 AM 02/14/2019   10:41 AM 12/31/2015    9:42 PM  Advanced Directives  Does Patient Have a Medical Advance Directive? Yes No Yes Yes Yes Yes No  Type of Estate agent of [place];Living will  Healthcare Power of [place];Living will Healthcare Power of [employer] of [place];Living will Living will   Does patient want to make changes to medical advance directive?      No - Patient declined   Copy of Healthcare Power of Attorney in Chart? No - copy requested  No - copy requested No - copy requested No - copy requested    Would patient like information  on creating a medical advance directive?  No - Patient declined         Current Medications (verified) Outpatient Encounter Medications as of 08/25/2023  Medication Sig   atorvastatin (LIPITOR) 10 MG tablet Take 1 tablet (10 mg total) by mouth at bedtime.   omeprazole (PRILOSEC) 20 MG capsule Take 1 capsule (20 mg total) by mouth daily.   sertraline (ZOLOFT) 50 MG tablet Take 1 tablet (50 mg total) by mouth daily.   levothyroxine (SYNTHROID) 75 MCG tablet Take 1 tablet (75 mcg total) by mouth once for 1 dose. TAKE ONE TABLET BY MOUTH DAILY   Facility-Administered Encounter Medications as of 08/25/2023  Medication   denosumab (PROLIA) injection 60 mg   [START ON 01/17/2024] denosumab (PROLIA) injection 60 mg    Allergies (verified) Lobster  [shellfish allergy] and Fosamax [alendronate]   History: Past Medical History:  Diagnosis Date   GAD (generalized anxiety disorder) 06/02/2018   Osteopenia 06/02/2018   DEXA 05/2016 lowest T = -1.8 10 FRAC score 1.8%, major frac score 9.9%; rec recheck 2 years and calcium and vit D.   Thyroid disease    Past Surgical History:  Procedure Laterality Date   ABDOMINAL HYSTERECTOMY  2009   CHOLECYSTECTOMY  2009   Family History  Problem Relation Age of Onset   Obesity Mother    Heart attack Father    Cancer Sister    Healthy Son    Healthy Son    Social History   Socioeconomic History  Marital status: Married    Spouse name: Not on file   Number of children: Not on file   Years of education: Not on file   Highest education level: Not on file  Occupational History   Occupation: retired  Tobacco Use   Smoking status: Never   Smokeless tobacco: Never  Substance and Sexual Activity   Alcohol use: Not Currently   Drug use: No   Sexual activity: Not on file  Other Topics Concern   Not on file  Social History Narrative   Previously lived in [place]    Has twin sisters who live in the area    Social Drivers of Health   Financial Resource  Strain: Low Risk  (08/25/2023)   Overall Financial Resource Strain (CARDIA)    Difficulty of Paying Living Expenses: Not hard at all  Food Insecurity: No Food Insecurity (08/25/2023)   Hunger Vital Sign    Worried About Running Out of Food in the Last Year: Never true    Ran Out of Food in the Last Year: Never true  Transportation Needs: No Transportation Needs (08/25/2023)   PRAPARE - Administrator, Civil Service (Medical): No    Lack of Transportation (Non-Medical): No  Physical Activity: Sufficiently Active (08/25/2023)   Exercise Vital Sign    Days of Exercise per Week: 5 days    Minutes of Exercise per Session: 30 min  Stress: No Stress Concern Present (08/25/2023)   [employer] of Occupational Health - Occupational Stress Questionnaire    Feeling of Stress : Not at all  Social Connections: Socially Integrated (08/25/2023)   Social Connection and Isolation Panel [NHANES]    Frequency of Communication with Friends and Family: Three times a week    Frequency of Social Gatherings with Friends and Family: Three times a week    Attends Religious Services: More than 4 times per year    Active Member of Clubs or Organizations: Yes    Attends Banker Meetings: 1 to 4 times per year    Marital Status: Married    Tobacco Counseling Counseling given: Not Answered    Clinical Intake:  Pre-visit preparation completed: Yes  Pain : No/denies pain     BMI - recorded: 22.89 Nutritional Status: BMI of 19-24  Normal Diabetes: No  No results found for: "HGBA1C"   How often do you need to have someone help you when you read instructions, pamphlets, or other written materials from your doctor or pharmacy?: 1 - Never  Interpreter Needed?: No  Information entered by :: Lanier Ensign, LPN   Activities of Daily Living     08/25/2023    1:11 PM  In your present state of health, do you have any difficulty performing the following activities:  Hearing? 0  Vision?  0  Difficulty concentrating or making decisions? 0  Walking or climbing stairs? 0  Dressing or bathing? 0  Doing errands, shopping? 0  Preparing Food and eating ? N  Using the Toilet? N  In the past six months, have you accidently leaked urine? N  Do you have problems with loss of bowel control? N  Managing your Medications? N  Managing your Finances? N  Housekeeping or managing your Housekeeping? N    Patient Care Team: Willow Ora, MD as PCP - General (Family Medicine)  Indicate any recent Medical Services you may have received from other than [facility] providers in the past year (date may be approximate).  Assessment:   This is a routine wellness examination for [place].  Hearing/Vision screen Hearing Screening - Comments:: Pt denies any hearing issues  Vision Screening - Comments:: Wears rx glasses - up to date with routine eye exams with Dr Cherlynn Polo     Goals Addressed   None    Depression Screen     08/25/2023    1:13 PM 06/25/2023    9:53 AM 02/24/2023    8:34 AM 07/20/2022    9:12 AM 06/22/2022   10:00 AM 06/13/2021   11:21 AM 06/13/2020    9:23 AM  PHQ 2/9 Scores  PHQ - 2 Score 0 0 0 0 0 0 0  PHQ- 9 Score  0 1        Fall Risk     08/25/2023    1:15 PM 06/25/2023    9:53 AM 02/24/2023    8:27 AM 07/20/2022    9:13 AM 06/22/2022   10:00 AM  Fall Risk   Falls in the past year? 0 0 0 0 0  Number falls in past yr: 0 0 0 0 0  Injury with Fall? 0 0 0 0 0  Risk for fall due to : No Fall Risks No Fall Risks No Fall Risks Impaired vision No Fall Risks  Follow up Falls prevention discussed Falls evaluation completed  Falls prevention discussed     MEDICARE RISK AT HOME:  Medicare Risk at Home Any stairs in or around the home?: Yes If so, are there any without handrails?: No Home free of loose throw rugs in walkways, pet beds, electrical cords, etc?: Yes Life alert?: No Use of a cane, walker or w/c?: No Grab bars in the bathroom?: No Shower chair or bench in  shower?: No Elevated toilet seat or a handicapped toilet?: No  TIMED UP AND GO:  Was the test performed?  No  Cognitive Function: 6CIT completed    02/14/2019   10:42 AM  MMSE - Mini Mental State Exam  Orientation to time 5  Orientation to Place 5  Registration 3  Attention/ Calculation 5  Recall 3  Language- name 2 objects 2  Language- repeat 1  Language- follow 3 step command 3  Language- read & follow direction 1  Write a sentence 1  Copy design 1  Total score 30        08/25/2023    1:15 PM 07/20/2022    9:14 AM 06/13/2021   11:25 AM 06/07/2020   11:14 AM  6CIT Screen  What Year? 0 points 0 points 0 points 0 points  What month? 0 points 0 points 0 points 0 points  What time? 0 points 0 points 0 points   Count back from 20 0 points 0 points 0 points 0 points  Months in reverse 0 points 0 points 0 points 0 points  Repeat phrase 0 points 0 points 2 points 0 points  Total Score 0 points 0 points 2 points     Immunizations Immunization History  Administered Date(s) Administered   Fluad Quad(high Dose 65+) 02/14/2019, 02/11/2020, 02/12/2021   Fluad Trivalent(High Dose 65+) 02/24/2023   Influenza, High Dose Seasonal PF 02/21/2015, 03/23/2016   Influenza-Unspecified 02/21/2017, 04/08/2022   PFIZER(Purple Top)SARS-COV-2 Vaccination 06/16/2019, 07/07/2019, 02/11/2020   Pneumococcal Conjugate-13 10/22/2015   Pneumococcal Polysaccharide-23 03/24/2017   Tdap 10/22/2015   Zoster Recombinant(Shingrix) 06/02/2018, 08/01/2018    Screening Tests Health Maintenance  Topic Date Due   COVID-19 Vaccine (4 - 2024-25 season) 01/24/2023   DEXA  SCAN  11/25/2023   INFLUENZA VACCINE  12/24/2023   Medicare Annual Wellness (AWV)  08/24/2024   DTaP/Tdap/Td (2 - Td or Tdap) 10/21/2025   Pneumonia Vaccine 61+ Years old  Completed   Zoster Vaccines- Shingrix  Completed   HPV VACCINES  Aged Out   Hepatitis C Screening  Discontinued    Health Maintenance  Health Maintenance Due   Topic Date Due   COVID-19 Vaccine (4 - 2024-25 season) 01/24/2023   Health Maintenance Items Addressed: See Nurse Notes  Additional Screening:  Vision Screening: Recommended annual ophthalmology exams for early detection of glaucoma and other disorders of the eye.  Dental Screening: Recommended annual dental exams for proper oral hygiene  Community Resource Referral / Chronic Care Management: CRR required this visit?  No   CCM required this visit?  No     Plan:     I have personally reviewed and noted the following in the patient's chart:   Medical and social history Use of alcohol, tobacco or illicit drugs  Current medications and supplements including opioid prescriptions. Patient is not currently taking opioid prescriptions. Functional ability and status Nutritional status Physical activity Advanced directives List of other physicians Hospitalizations, surgeries, and ER visits in previous 12 months Vitals Screenings to include cognitive, depression, and falls Referrals and appointments  In addition, I have reviewed and discussed with patient certain preventive protocols, quality metrics, and best practice recommendations. A written personalized care plan for preventive services as well as general preventive health recommendations were provided to patient.     Marzella Schlein, LPN   10/25/1306   After Visit Summary: (MyChart) Due to this being a telephonic visit, the after visit summary with patients personalized plan was offered to patient via MyChart   Notes: Nothing significant to report at this time.

## 2023-09-02 DIAGNOSIS — M5432 Sciatica, left side: Secondary | ICD-10-CM | POA: Diagnosis not present

## 2023-09-02 DIAGNOSIS — M9903 Segmental and somatic dysfunction of lumbar region: Secondary | ICD-10-CM | POA: Diagnosis not present

## 2023-09-06 DIAGNOSIS — M5432 Sciatica, left side: Secondary | ICD-10-CM | POA: Diagnosis not present

## 2023-09-06 DIAGNOSIS — M9903 Segmental and somatic dysfunction of lumbar region: Secondary | ICD-10-CM | POA: Diagnosis not present

## 2023-09-07 DIAGNOSIS — M5432 Sciatica, left side: Secondary | ICD-10-CM | POA: Diagnosis not present

## 2023-09-07 DIAGNOSIS — M9903 Segmental and somatic dysfunction of lumbar region: Secondary | ICD-10-CM | POA: Diagnosis not present

## 2023-09-09 DIAGNOSIS — M5432 Sciatica, left side: Secondary | ICD-10-CM | POA: Diagnosis not present

## 2023-09-09 DIAGNOSIS — M9903 Segmental and somatic dysfunction of lumbar region: Secondary | ICD-10-CM | POA: Diagnosis not present

## 2023-09-13 DIAGNOSIS — M9903 Segmental and somatic dysfunction of lumbar region: Secondary | ICD-10-CM | POA: Diagnosis not present

## 2023-09-13 DIAGNOSIS — M5432 Sciatica, left side: Secondary | ICD-10-CM | POA: Diagnosis not present

## 2023-09-16 DIAGNOSIS — M5432 Sciatica, left side: Secondary | ICD-10-CM | POA: Diagnosis not present

## 2023-09-16 DIAGNOSIS — M9903 Segmental and somatic dysfunction of lumbar region: Secondary | ICD-10-CM | POA: Diagnosis not present

## 2023-09-20 DIAGNOSIS — M9903 Segmental and somatic dysfunction of lumbar region: Secondary | ICD-10-CM | POA: Diagnosis not present

## 2023-09-20 DIAGNOSIS — M5432 Sciatica, left side: Secondary | ICD-10-CM | POA: Diagnosis not present

## 2023-09-22 DIAGNOSIS — M5432 Sciatica, left side: Secondary | ICD-10-CM | POA: Diagnosis not present

## 2023-09-22 DIAGNOSIS — M9903 Segmental and somatic dysfunction of lumbar region: Secondary | ICD-10-CM | POA: Diagnosis not present

## 2023-09-27 DIAGNOSIS — M9903 Segmental and somatic dysfunction of lumbar region: Secondary | ICD-10-CM | POA: Diagnosis not present

## 2023-09-27 DIAGNOSIS — M5432 Sciatica, left side: Secondary | ICD-10-CM | POA: Diagnosis not present

## 2023-09-30 DIAGNOSIS — M5432 Sciatica, left side: Secondary | ICD-10-CM | POA: Diagnosis not present

## 2023-09-30 DIAGNOSIS — M9903 Segmental and somatic dysfunction of lumbar region: Secondary | ICD-10-CM | POA: Diagnosis not present

## 2023-10-05 DIAGNOSIS — M5432 Sciatica, left side: Secondary | ICD-10-CM | POA: Diagnosis not present

## 2023-10-05 DIAGNOSIS — M9903 Segmental and somatic dysfunction of lumbar region: Secondary | ICD-10-CM | POA: Diagnosis not present

## 2023-10-07 DIAGNOSIS — M5432 Sciatica, left side: Secondary | ICD-10-CM | POA: Diagnosis not present

## 2023-10-07 DIAGNOSIS — M9903 Segmental and somatic dysfunction of lumbar region: Secondary | ICD-10-CM | POA: Diagnosis not present

## 2023-10-27 DIAGNOSIS — M5432 Sciatica, left side: Secondary | ICD-10-CM | POA: Diagnosis not present

## 2023-10-27 DIAGNOSIS — M9903 Segmental and somatic dysfunction of lumbar region: Secondary | ICD-10-CM | POA: Diagnosis not present

## 2023-11-09 ENCOUNTER — Telehealth: Payer: Self-pay | Admitting: Family Medicine

## 2023-11-09 MED ORDER — SERTRALINE HCL 50 MG PO TABS
50.0000 mg | ORAL_TABLET | Freq: Every day | ORAL | 3 refills | Status: AC
Start: 2023-11-09 — End: ?

## 2023-11-09 NOTE — Telephone Encounter (Signed)
 Copied from CRM [id number]. Topic: Clinical - Medication Refill >> Nov 09, 2023  1:41 PM [place] P wrote: Medication: sertraline  (ZOLOFT ) 50 MG tablet  Has the patient contacted their pharmacy? Yes- stated she is not due for refill however pt only has 3 pills left. (Agent: If no, request that the patient contact the pharmacy for the refill. If patient does not wish to contact the pharmacy document the reason why and proceed with request.) (Agent: If yes, when and what did the pharmacy advise?)  This is the patient's preferred pharmacy:  [id number] - [place], [place] - [address] [place] [address] Phone: [phone] Fax: [phone]  Is this the correct pharmacy for this prescription? Yes If no, delete pharmacy and type the correct one.   Has the prescription been filled recently? No  Is the patient out of the medication? No- has 3 pills left  Has the patient been seen for an appointment in the last year OR does the patient have an upcoming appointment? Yes  Can we respond through MyChart? Yes  Agent: Please be advised that Rx refills may take up to 3 business days. We ask that you follow-up with your pharmacy.

## 2023-11-24 DIAGNOSIS — M9903 Segmental and somatic dysfunction of lumbar region: Secondary | ICD-10-CM | POA: Diagnosis not present

## 2023-11-24 DIAGNOSIS — M5432 Sciatica, left side: Secondary | ICD-10-CM | POA: Diagnosis not present

## 2023-12-17 ENCOUNTER — Telehealth: Payer: Self-pay

## 2023-12-17 ENCOUNTER — Other Ambulatory Visit (HOSPITAL_COMMUNITY): Payer: Self-pay

## 2023-12-17 NOTE — Telephone Encounter (Signed)
 Pt ready for scheduling for PROLIA  on or after : 01/17/24  Option# 1: Buy/Bill (Office supplied medication)  Out-of-pocket cost due at time of clinic visit: $357  Number of injection/visits approved: 2  Primary: UHC MEDICARE Prolia  co-insurance: 20% Admin fee co-insurance: 20%  Secondary: --- Prolia  co-insurance:  Admin fee co-insurance:   Medical Benefit Details: Date Benefits were checked: 12/17/23 Deductible: NO/ Coinsurance: 20%/ Admin Fee: 20%  Prior Auth: APPROVED PA# [id number] Expiration Date: 07/09/23-07/08/24  # of doses approved: 2 ----------------------------------------------------------------------- Option# 2- Med Obtained from pharmacy:  Pharmacy benefit: Copay $555 (Paid to pharmacy) Admin Fee: 20% (Pay at clinic)  Prior Auth: N/A PA# Expiration Date:   # of doses approved:   If patient wants fill through the pharmacy benefit please send prescription to: [facility], and include estimated need by date in rx notes. Pharmacy will ship medication directly to the office.  Patient NOT eligible for Prolia  Copay Card. Copay Card can make patient's cost as little as $25. Link to apply: [email or web address]://[email or web address]  ** This summary of benefits is an estimation of the patient's out-of-pocket cost. Exact cost may very based on individual plan coverage.

## 2023-12-17 NOTE — Telephone Encounter (Signed)
 SABRA

## 2023-12-17 NOTE — Telephone Encounter (Signed)
 Prolia  VOB initiated via [email or web address]  Next Prolia  inj DUE: 01/17/24

## 2023-12-29 DIAGNOSIS — M25512 Pain in left shoulder: Secondary | ICD-10-CM | POA: Diagnosis not present

## 2023-12-29 DIAGNOSIS — R030 Elevated blood-pressure reading, without diagnosis of hypertension: Secondary | ICD-10-CM | POA: Diagnosis not present

## 2024-01-04 DIAGNOSIS — L570 Actinic keratosis: Secondary | ICD-10-CM | POA: Diagnosis not present

## 2024-01-04 DIAGNOSIS — L820 Inflamed seborrheic keratosis: Secondary | ICD-10-CM | POA: Diagnosis not present

## 2024-01-04 DIAGNOSIS — L814 Other melanin hyperpigmentation: Secondary | ICD-10-CM | POA: Diagnosis not present

## 2024-01-04 DIAGNOSIS — L918 Other hypertrophic disorders of the skin: Secondary | ICD-10-CM | POA: Diagnosis not present

## 2024-01-04 DIAGNOSIS — L821 Other seborrheic keratosis: Secondary | ICD-10-CM | POA: Diagnosis not present

## 2024-01-04 DIAGNOSIS — Z85828 Personal history of other malignant neoplasm of skin: Secondary | ICD-10-CM | POA: Diagnosis not present

## 2024-01-11 ENCOUNTER — Ambulatory Visit: Payer: Self-pay | Admitting: Family Medicine

## 2024-01-11 NOTE — Telephone Encounter (Signed)
 Appt tomorrow
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
## 2024-01-12 ENCOUNTER — Ambulatory Visit (INDEPENDENT_AMBULATORY_CARE_PROVIDER_SITE_OTHER): Admitting: Family

## 2024-01-12 ENCOUNTER — Encounter: Payer: Self-pay | Admitting: Family

## 2024-01-12 VITALS — BP 124/74 | HR 73 | Ht 67.2 in | Wt 152.0 lb

## 2024-01-12 DIAGNOSIS — M25512 Pain in left shoulder: Secondary | ICD-10-CM | POA: Diagnosis not present

## 2024-01-12 MED ORDER — DICLOFENAC SODIUM 1 % EX GEL
4.0000 g | Freq: Four times a day (QID) | CUTANEOUS | 0 refills | Status: DC | PRN
Start: 2024-01-12 — End: 2024-01-31

## 2024-01-12 MED ORDER — MELOXICAM 15 MG PO TABS: 15.0000 mg | ORAL_TABLET | Freq: Every day | ORAL | 0 refills | Status: DC
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
## 2024-01-19 ENCOUNTER — Ambulatory Visit

## 2024-01-20 ENCOUNTER — Ambulatory Visit (INDEPENDENT_AMBULATORY_CARE_PROVIDER_SITE_OTHER)

## 2024-01-20 DIAGNOSIS — M816 Localized osteoporosis [Lequesne]: Secondary | ICD-10-CM

## 2024-01-20 MED ORDER — DENOSUMAB 60 MG/ML ~~LOC~~ SOSY: 60.0000 mg | PREFILLED_SYRINGE | SUBCUTANEOUS | Status: DC

## 2024-01-20 NOTE — Progress Notes (Signed)
 Pt came in on the Nurse schedule to receive prolia  Injection. Administered in the back of the Rt arm without any complaints. Pt has been advised to schedule her next injection for 6mos. Pt verbalized understanding

## 2024-01-28 NOTE — Progress Notes (Signed)
   LILLETTE Ileana Collet, PhD, LAT, ATC acting as a scribe for Artist Lloyd, MD.  Patricia Patrick is a 83 y.o. female who presents to [facility] today for L shoulder pain ongoing since the end of July. [place], becoming airborne, coming down on her L elbow. Jarring the humerus into the [employer] joint  She is LHD.  Radiates: no Aggravates: reaching overhead, IR, eXt, gripping Treatments tried: naproxen, Tylenol , diclofenac  gel, meloxicam   Dx testing: 11/24/21 DEXA  Pertinent review of systems: No fevers or chills  Relevant historical information: Osteoporosis   Exam:  BP (!) 162/84   Pulse (!) 56   Ht 5' 7 (1.702 m)   Wt 153 lb (69.4 kg)   SpO2 97%   BMI 23.96 kg/m  General: Well Developed, well nourished, and in no acute distress.   MSK: Left shoulder normal appearing Normal motion pain with abduction. Strength is intact although patient does have some pain with resisted abduction. Positive Hawkins and Neer's test. Negative Yergason's and speeds test.    Lab and Radiology Results  Diagnostic Limited MSK Ultrasound of: Left shoulder Biceps tendon is intact and normal-appearing. Subscapularis tendon is intact. Supraspinatus tendon is intact.  She does have some calcific change at the distal tendon insertion.  No visible retracted tear. Infraspinatus tendon is intact. AC joint degenerative with effusion. Impression: AC DJD and chronic calcific tendinitis supraspinatus tendon.   X-ray images left shoulder obtained today personally and independently interpreted. No acute fractures.  No severe DJD. Await formal radiology review   Assessment and Plan: 83 y.o. female with left shoulder pain ongoing for about 6 weeks after a jarring [place].  Plan for repeat x-ray today and physical therapy.  She lives in [place] so we will use the [facility] PT location in [place].  Recheck in  4 weeks if not improved consider injection or MRI.   PDMP not reviewed this encounter. Orders Placed This Encounter  Procedures   DG Shoulder Left    Standing Status:   Future    Expiration Date:   03/01/2024    Reason for Exam (SYMPTOM  OR DIAGNOSIS REQUIRED):   left shoulder pain    Preferred imaging location?:   [place]   US  LIMITED JOINT SPACE STRUCTURES UP LEFT(NO LINKED CHARGES)    Reason for Exam (SYMPTOM  OR DIAGNOSIS REQUIRED):   left shoulder pain    Preferred imaging location?:   [facility] referral to Physical Therapy    Referral Priority:   Routine    Referral Type:   Physical Medicine    Referral Reason:   Specialty Services Required    Requested Specialty:   Physical Therapy    Number of Visits Requested:   1   No orders of the defined types were placed in this encounter.    Discussed warning signs or symptoms. Please see discharge instructions. Patient expresses understanding.   The above documentation has been reviewed and is accurate and complete Artist Lloyd, M.D.

## 2024-01-31 ENCOUNTER — Other Ambulatory Visit: Payer: Self-pay

## 2024-01-31 ENCOUNTER — Ambulatory Visit

## 2024-01-31 ENCOUNTER — Ambulatory Visit: Admitting: Family Medicine

## 2024-01-31 VITALS — BP 162/84 | HR 56 | Ht 67.0 in | Wt 153.0 lb

## 2024-01-31 DIAGNOSIS — M25512 Pain in left shoulder: Secondary | ICD-10-CM

## 2024-01-31 DIAGNOSIS — G8929 Other chronic pain: Secondary | ICD-10-CM | POA: Diagnosis not present

## 2024-01-31 NOTE — Patient Instructions (Addendum)
 Thank you for coming in today.   Please get an Xray today before you leave   I've referred you to Physical Therapy.  Let us  know if you don't hear from them in one week.   Check back in 4 weeks

## 2024-02-07 ENCOUNTER — Ambulatory Visit: Payer: Self-pay | Admitting: Family Medicine

## 2024-02-07 NOTE — Progress Notes (Signed)
Left shoulder x-ray looks pretty normal to radiology.

## 2024-02-17 ENCOUNTER — Ambulatory Visit

## 2024-02-17 DIAGNOSIS — Z23 Encounter for immunization: Secondary | ICD-10-CM

## 2024-03-01 DIAGNOSIS — M25512 Pain in left shoulder: Secondary | ICD-10-CM | POA: Diagnosis not present

## 2024-03-08 DIAGNOSIS — M25512 Pain in left shoulder: Secondary | ICD-10-CM | POA: Diagnosis not present

## 2024-03-10 DIAGNOSIS — M25512 Pain in left shoulder: Secondary | ICD-10-CM | POA: Diagnosis not present

## 2024-03-15 DIAGNOSIS — M25512 Pain in left shoulder: Secondary | ICD-10-CM | POA: Diagnosis not present

## 2024-03-22 DIAGNOSIS — M25512 Pain in left shoulder: Secondary | ICD-10-CM | POA: Diagnosis not present

## 2024-05-18 ENCOUNTER — Other Ambulatory Visit: Payer: Self-pay | Admitting: Family

## 2024-05-18 DIAGNOSIS — M25512 Pain in left shoulder: Secondary | ICD-10-CM

## 2024-06-22 ENCOUNTER — Other Ambulatory Visit: Payer: Self-pay | Admitting: Family Medicine

## 2024-06-26 ENCOUNTER — Telehealth: Payer: Self-pay

## 2024-06-26 NOTE — Telephone Encounter (Signed)
 Prolia  VOB initiated via [email or web address]  Next Prolia  inj DUE: 07/22/24

## 2024-06-27 ENCOUNTER — Encounter: Payer: Self-pay | Admitting: Family Medicine

## 2024-06-27 ENCOUNTER — Ambulatory Visit: Payer: Medicare Other | Admitting: Family Medicine

## 2024-06-27 ENCOUNTER — Other Ambulatory Visit (HOSPITAL_COMMUNITY): Payer: Self-pay

## 2024-06-27 VITALS — BP 132/74 | HR 73 | Temp 97.7°F | Ht 67.0 in | Wt 152.0 lb

## 2024-06-27 DIAGNOSIS — Z780 Asymptomatic menopausal state: Secondary | ICD-10-CM

## 2024-06-27 DIAGNOSIS — E039 Hypothyroidism, unspecified: Secondary | ICD-10-CM

## 2024-06-27 DIAGNOSIS — E782 Mixed hyperlipidemia: Secondary | ICD-10-CM

## 2024-06-27 DIAGNOSIS — M816 Localized osteoporosis [Lequesne]: Secondary | ICD-10-CM

## 2024-06-27 DIAGNOSIS — Z0000 Encounter for general adult medical examination without abnormal findings: Secondary | ICD-10-CM | POA: Diagnosis not present

## 2024-06-27 DIAGNOSIS — E538 Deficiency of other specified B group vitamins: Secondary | ICD-10-CM | POA: Diagnosis not present

## 2024-06-27 DIAGNOSIS — K219 Gastro-esophageal reflux disease without esophagitis: Secondary | ICD-10-CM | POA: Diagnosis not present

## 2024-06-27 DIAGNOSIS — R030 Elevated blood-pressure reading, without diagnosis of hypertension: Secondary | ICD-10-CM

## 2024-06-27 DIAGNOSIS — Z0001 Encounter for general adult medical examination with abnormal findings: Secondary | ICD-10-CM

## 2024-06-27 LAB — COMPREHENSIVE METABOLIC PANEL WITH GFR
ALT: 11 U/L (ref 3–35)
AST: 17 U/L (ref 5–37)
Albumin: 4.2 g/dL (ref 3.5–5.2)
Alkaline Phosphatase: 91 U/L (ref 39–117)
BUN: 20 mg/dL (ref 6–23)
CO2: 29 meq/L (ref 19–32)
Calcium: 9 mg/dL (ref 8.4–10.5)
Chloride: 105 meq/L (ref 96–112)
Creatinine, Ser: 0.85 mg/dL (ref 0.40–1.20)
GFR: 63.72 mL/min
Glucose, Bld: 78 mg/dL (ref 70–99)
Potassium: 4.1 meq/L (ref 3.5–5.1)
Sodium: 142 meq/L (ref 135–145)
Total Bilirubin: 0.4 mg/dL (ref 0.2–1.2)
Total Protein: 6.2 g/dL (ref 6.0–8.3)

## 2024-06-27 LAB — CBC WITH DIFFERENTIAL/PLATELET
Basophils Absolute: 0 10*3/uL (ref 0.0–0.1)
Basophils Relative: 0.7 % (ref 0.0–3.0)
Eosinophils Absolute: 0.1 10*3/uL (ref 0.0–0.7)
Eosinophils Relative: 1.8 % (ref 0.0–5.0)
HCT: 39.6 % (ref 36.0–46.0)
Hemoglobin: 13.7 g/dL (ref 12.0–15.0)
Lymphocytes Relative: 21.4 % (ref 12.0–46.0)
Lymphs Abs: 1.2 10*3/uL (ref 0.7–4.0)
MCHC: 34.5 g/dL (ref 30.0–36.0)
MCV: 86.5 fl (ref 78.0–100.0)
Monocytes Absolute: 0.2 10*3/uL (ref 0.1–1.0)
Monocytes Relative: 4 % (ref 3.0–12.0)
Neutro Abs: 4 10*3/uL (ref 1.4–7.7)
Neutrophils Relative %: 72.1 % (ref 43.0–77.0)
Platelets: 204 10*3/uL (ref 150.0–400.0)
RBC: 4.59 Mil/uL (ref 3.87–5.11)
RDW: 13.4 % (ref 11.5–15.5)
WBC: 5.6 10*3/uL (ref 4.0–10.5)

## 2024-06-27 LAB — LIPID PANEL
Cholesterol: 184 mg/dL (ref 28–200)
HDL: 81.6 mg/dL
LDL Cholesterol: 72 mg/dL (ref 10–99)
NonHDL: 102.19
Total CHOL/HDL Ratio: 2
Triglycerides: 149 mg/dL (ref 10.0–149.0)
VLDL: 29.8 mg/dL (ref 0.0–40.0)

## 2024-06-27 LAB — VITAMIN D 25 HYDROXY (VIT D DEFICIENCY, FRACTURES): VITD: 36.99 ng/mL (ref 30.00–100.00)

## 2024-06-27 LAB — VITAMIN B12: Vitamin B-12: 1138 pg/mL — ABNORMAL HIGH (ref 211–911)

## 2024-06-27 LAB — TSH: TSH: 0.51 u[IU]/mL (ref 0.35–5.50)

## 2024-06-27 MED ORDER — ATORVASTATIN CALCIUM 10 MG PO TABS: 10.0000 mg | ORAL_TABLET | Freq: Every evening | ORAL | 3 refills | Status: AC

## 2024-06-27 NOTE — Telephone Encounter (Signed)
 Patricia Patrick

## 2024-06-27 NOTE — Telephone Encounter (Signed)
 Pt ready for scheduling for PROLIA  on or after : 07/22/24  Option# 1: Buy/Bill (Office supplied medication)  Out-of-pocket cost due at time of clinic visit: $377  Number of injection/visits approved: 2  Primary: [employer]-MEDICARE Prolia  co-insurance: 20% Admin fee co-insurance: 20%  Secondary: --- Prolia  co-insurance:  Admin fee co-insurance:   Medical Benefit Details: Date Benefits were checked: 06/26/24 Deductible: NO/ Coinsurance: 20%/ Admin Fee: 20%  Prior Auth: APPROVED PA# [id number] Expiration Date: 07/22/24-07/22/25  # of doses approved: 2 ----------------------------------------------------------------------- Option# 2- Med Obtained from pharmacy: [facility] PREFERRED FOR PHARMACY BENEFIT  Pharmacy benefit: Copay $843.71 (Paid to pharmacy) Admin Fee: 20% (Pay at clinic)  Prior Auth: N/A PA# Expiration Date:   # of doses approved:   If patient wants fill through the pharmacy benefit please send prescription to: [facility], and include estimated need by date in rx notes. Pharmacy will ship medication directly to the office.  Patient NOT eligible for Prolia  Copay Card. Copay Card can make patient's cost as little as $25. Link to apply: [email or web address]://[email or web address]  ** This summary of benefits is an estimation of the patient's out-of-pocket cost. Exact cost may very based on individual plan coverage.

## 2024-06-27 NOTE — Telephone Encounter (Signed)
 SABRA

## 2024-06-28 ENCOUNTER — Ambulatory Visit: Payer: Self-pay | Admitting: Family Medicine

## 2024-06-28 NOTE — Progress Notes (Signed)
 See mychart note Dear [name] seeing you today and yesterday! Your labs continue to corroborate your excellent health.  Take care. Sincerely, Dr. Jodie

## 2024-06-30 NOTE — Telephone Encounter (Signed)
 Looks like it was auto closed before I finished checking the benefits. When I went to complete the referral it had been closed out. I can go ahead and complete the referral, not sure if it will still come up as closed.

## 2024-07-06 ENCOUNTER — Other Ambulatory Visit

## 2024-08-01 ENCOUNTER — Ambulatory Visit

## 2025-06-28 ENCOUNTER — Encounter: Admitting: Family Medicine
